# Patient Record
Sex: Female | Born: 1990 | Hispanic: No | Marital: Married | Smoking: Never smoker
Health system: Southern US, Community
[De-identification: ages and names within clinical notes are randomized; demographics above are authoritative.]

## PROBLEM LIST (undated history)

## (undated) DIAGNOSIS — T8859XA Other complications of anesthesia, initial encounter: Secondary | ICD-10-CM

## (undated) DIAGNOSIS — R200 Anesthesia of skin: Secondary | ICD-10-CM

## (undated) DIAGNOSIS — L709 Acne, unspecified: Secondary | ICD-10-CM

## (undated) DIAGNOSIS — B360 Pityriasis versicolor: Secondary | ICD-10-CM

## (undated) DIAGNOSIS — D649 Anemia, unspecified: Secondary | ICD-10-CM

## (undated) DIAGNOSIS — F909 Attention-deficit hyperactivity disorder, unspecified type: Secondary | ICD-10-CM

## (undated) HISTORY — DX: Acne, unspecified: L70.9

## (undated) HISTORY — DX: Pityriasis versicolor: B36.0

## (undated) HISTORY — DX: Attention-deficit hyperactivity disorder, unspecified type: F90.9

---
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
## 2011-04-20 ENCOUNTER — Encounter: Payer: Self-pay | Admitting: Family Medicine

## 2011-04-20 ENCOUNTER — Ambulatory Visit (INDEPENDENT_AMBULATORY_CARE_PROVIDER_SITE_OTHER): Payer: BC Managed Care – PPO | Admitting: Family Medicine

## 2011-04-20 VITALS — BP 102/60 | HR 95 | Temp 98.6°F | Ht 66.0 in | Wt 126.0 lb

## 2011-04-20 DIAGNOSIS — B360 Pityriasis versicolor: Secondary | ICD-10-CM

## 2011-04-20 DIAGNOSIS — F909 Attention-deficit hyperactivity disorder, unspecified type: Secondary | ICD-10-CM

## 2011-04-20 DIAGNOSIS — L708 Other acne: Secondary | ICD-10-CM

## 2011-04-20 DIAGNOSIS — L709 Acne, unspecified: Secondary | ICD-10-CM

## 2011-04-20 MED ORDER — AMPHETAMINE-DEXTROAMPHETAMINE 20 MG PO TABS: 20.0000 mg | ORAL_TABLET | Freq: Two times a day (BID) | ORAL | Status: DC

## 2011-04-20 MED ORDER — MINOCYCLINE HCL 100 MG PO TABS: 100.0000 mg | ORAL_TABLET | Freq: Two times a day (BID) | ORAL | Status: DC

## 2011-04-20 MED ORDER — KETOCONAZOLE 200 MG PO TABS: ORAL_TABLET | ORAL | Status: DC

## 2011-04-20 NOTE — Progress Notes (Signed)
20 yo here to establish care.  1.  Tinea versicolor- started on back several months ago.  Saw her pediatrician, given Selenium shampoo.  Has not helped, now has it on back of both her legs as well. Itchy.  2. ADHD- per pt, formal evaluation and diagnosis in 4th grade. On Adderall 20 mg twice daily.  Takes it year round.  3.  Acne- takes Minocycline which has greatly improved her skin. Has been on this for several months.  Well woman- G0. Sexually active with one partner, on Brevicon OCPs. No h/o STDs.  Patient Active Problem List  Diagnoses  . ADHD (attention deficit hyperactivity disorder)  . Tinea versicolor  . Acne   Past Medical History  Diagnosis Date  . ADHD (attention deficit hyperactivity disorder)   . Tinea versicolor   . Acne    No past surgical history on file. History  Substance Use Topics  . Smoking status: Never Smoker   . Smokeless tobacco: Not on file  . Alcohol Use: Not on file   No Known Allergies  The PMH, PSH, Social History, Family History, Medications, and allergies have been reviewed in [facility], and have been updated if relevant.  ROS: See HPI Patient reports no  vision/ hearing changes,anorexia, weight change, fever ,adenopathy, persistant / recurrent hoarseness, swallowing issues, chest pain, edema,persistant / recurrent cough, hemoptysis, dyspnea(rest, exertional, paroxysmal nocturnal), gastrointestinal  bleeding (melena, rectal bleeding), abdominal pain, excessive heart burn, GU symptoms(dysuria, hematuria, pyuria, voiding/incontinence  Issues) syncope, focal weakness, severe memory loss, depression, anxiety, abnormal bruising/bleeding, major joint swelling, breast masses or abnormal vaginal bleeding.    Physical exam: BP 102/60  Pulse 95  Temp(Src) 98.6 F (37 C) (Oral)  Ht 5\' 6"  (1.676 m)  Wt 126 lb (57.153 kg)  BMI 20.34 kg/m2  LMP 03/29/2011  General:  Well-developed,well-nourished,in no acute distress; alert,appropriate and cooperative  throughout examination Head:  normocephalic and atraumatic.   Eyes:  vision grossly intact, pupils equal, pupils round, and pupils reactive to light.   Ears:  R ear normal and L ear normal.   Nose:  no external deformity.   Mouth:  good dentition.   Neck:  No deformities, masses, or tenderness noted. Lungs:  Normal respiratory effort, chest expands symmetrically. Lungs are clear to auscultation, no crackles or wheezes. Heart:  Normal rate and regular rhythm. S1 and S2 normal without gallop, murmur, click, rub or other extra sounds. Abdomen:  Bowel sounds positive,abdomen soft and non-tender without masses, organomegaly or hernias noted. Msk:  No deformity or scoliosis noted of thoracic or lumbar spine.   Extremities:  No clubbing, cyanosis, edema, or deformity noted with normal full range of motion of all joints.   Neurologic:  alert & oriented X3 and gait normal.   Skin: pos hypopigemented leasions on back and back of leg consistent with tinea versicolor Cervical Nodes:  No lymphadenopathy noted Axillary Nodes:  No palpable lymphadenopathy Psych:  Cognition and judgment appear intact. Alert and cooperative with normal attention span and concentration. No apparent delusions, illusions, hallucinations  1. ADHD (attention deficit hyperactivity disorder)  Stable. Refilled her adderall rx. Awaiting records  2. Tinea versicolor  Deteriorated. Will treat with oral ketoconazole 200 mg daily x 7 days. Pt to call in 1-2 weeks with an update. Also given handout about tinea versicolor.  3. Acne  Stable. Continue minocycline, OCPs.

## 2011-04-20 NOTE — Patient Instructions (Signed)
Tinea Versicolor (Yeast Infection of the Skin) Tinea versicolor is a common yeast infection of the skin. This condition becomes known when the yeast on our skin starts to overgrow (yeast is a normal inhabitant on our skin). This condition is noticed as white or light brown patches on brown skin, and is more evident in the summer on tanned skin. These areas are slightly scaly if scratched. The light patches from the yeast become evident when the yeast creates "holes in your suntan". This is most often noticed in the summer. The patches are usually located on the chest, back, pubis, neck and body folds. However, it may occur on any area of body. Mild itching and inflammation (redness or soreness) may be present. DIAGNOSIS The diagnosis of this is made clinically (by looking). Cultures from samples are usually not needed. Examination under the microscope may help. However, yeast is normally found on skin. The diagnosis still remains clinical. Examination under Wood's Ultraviolet Light can determine the extent of the infection. TREATMENT This common infection is usually only of cosmetic (only a concern to your appearance). It is easily treated with dandruff shampoo such as Selsun Blue used during showers or bathing. Vigorous scrubbing will eliminate the yeast over several days time. The light areas in your skin may remain for weeks or months after the infection is cured unless your skin is exposed to sunlight. The lighter or darker spots caused by the fungus that remain after complete treatment are not a sign of treatment failure; it will take a long time to resolve. Your caregiver may recommend a number of commercial preparations or medication by mouth if home care is not working. Recurrence is common and preventative medication may be necessary. This skin condition is not highly contagious. Special care is not needed to protect close friends and family members. Normal hygiene is usually enough.   Great to  meet you!

## 2011-05-19 ENCOUNTER — Other Ambulatory Visit: Payer: Self-pay | Admitting: *Deleted

## 2011-05-19 MED ORDER — AMPHETAMINE-DEXTROAMPHETAMINE 20 MG PO TABS: 20.0000 mg | ORAL_TABLET | Freq: Two times a day (BID) | ORAL | Status: DC

## 2011-05-20 NOTE — Telephone Encounter (Signed)
Patient advised rx ready for pick up 

## 2011-06-21 ENCOUNTER — Other Ambulatory Visit: Payer: Self-pay | Admitting: *Deleted

## 2011-06-21 MED ORDER — AMPHETAMINE-DEXTROAMPHETAMINE 20 MG PO TABS: 20.0000 mg | ORAL_TABLET | Freq: Two times a day (BID) | ORAL | Status: DC

## 2011-06-21 NOTE — Telephone Encounter (Signed)
Please call pt when ready.

## 2011-06-22 NOTE — Telephone Encounter (Signed)
Dr. Dayton Martes, please reprint script when you are back in the office tomorrow.  We cant find the script that was printed yesterday.

## 2011-06-23 MED ORDER — AMPHETAMINE-DEXTROAMPHETAMINE 20 MG PO TABS: 20.0000 mg | ORAL_TABLET | Freq: Two times a day (BID) | ORAL | Status: DC

## 2011-06-23 NOTE — Telephone Encounter (Signed)
Rx printed

## 2011-06-23 NOTE — Telephone Encounter (Signed)
Left message on cell phone voicemail advising patient that Rx is ready for pick up will be left at front desk. 

## 2011-06-24 ENCOUNTER — Ambulatory Visit (INDEPENDENT_AMBULATORY_CARE_PROVIDER_SITE_OTHER): Payer: BC Managed Care – PPO | Admitting: Family Medicine

## 2011-06-24 ENCOUNTER — Encounter: Payer: Self-pay | Admitting: Family Medicine

## 2011-06-24 VITALS — BP 122/64 | HR 92 | Temp 98.1°F | Wt 129.0 lb

## 2011-06-24 DIAGNOSIS — M549 Dorsalgia, unspecified: Secondary | ICD-10-CM

## 2011-06-24 DIAGNOSIS — M545 Low back pain, unspecified: Secondary | ICD-10-CM

## 2011-06-24 LAB — POCT URINALYSIS DIPSTICK
Blood, UA: NEGATIVE
Glucose, UA: NEGATIVE
Ketones, UA: NEGATIVE
Spec Grav, UA: 1.03
Urobilinogen, UA: 0.2

## 2011-06-24 MED ORDER — CYCLOBENZAPRINE HCL 5 MG PO TABS: 5.0000 mg | ORAL_TABLET | Freq: Two times a day (BID) | ORAL | Status: DC | PRN

## 2011-06-24 NOTE — Progress Notes (Signed)
  Subjective:    Patient ID: Mercedes Kramer, female    DOB: Mar 28, 1991, 20 y.o.   MRN: [id number]  HPI CC: L sided back pain  3d h/o L back pain in flank region, crampy pain, no radiation.  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  Worse with twisting of back.  Has tried tylenol which helped some.  Denies new exercise routine, injury/trauma.  Denies dysuria, abd pain, n/v/f/c.  Denies hematuria.  However, did help grandmother move over weekend.  H/o UTI, last >6 mo ago, never had kidney stone.  Review of Systems Per HPI    Objective:   Physical Exam  Nursing note and vitals reviewed. Constitutional: She appears well-developed and well-nourished. No distress.  HENT:  Head: Normocephalic and atraumatic.  Mouth/Throat: Oropharynx is clear and moist. No oropharyngeal exudate.  Eyes: Conjunctivae and EOM are normal. Pupils are equal, round, and reactive to light. No scleral icterus.  Cardiovascular: Normal rate, regular rhythm, normal heart sounds and intact distal pulses.   No murmur heard. Pulmonary/Chest: Effort normal and breath sounds normal. No respiratory distress. She has no wheezes. She has no rales.  Abdominal: Soft. Bowel sounds are normal. She exhibits no distension and no mass. There is no hepatosplenomegaly. There is no tenderness. There is no rebound, no guarding and no CVA tenderness.  Musculoskeletal: She exhibits no edema.       Thoracic back: Normal.       Lumbar back: Normal.       Back:       Tender to palpation left lateral lower back (below flank region), worse with extension and right flexion at spine  Skin: Skin is warm and dry. No rash noted.  Psychiatric: She has a normal mood and affect.          Assessment & Plan:

## 2011-06-24 NOTE — Patient Instructions (Signed)
This sounds more like a muscular strain. Take tylenol for pain and may use flexeril muscle relaxant for muscle strain (caution it may make you sleepy so just take when you're at home). If fever >101 develops, or nausea/vomiting or burning with urination, or not improving as expected, please return to be seen. Good to see you today, call us with questions.

## 2011-06-24 NOTE — Assessment & Plan Note (Signed)
Left lower back pain reproducible with palpation, positional.   UA normal. Anticipate MSK issue.  Treat with tylenol, ice/heat, flexeril low dose. Update if any concerning sxs as per instructions.  Push fluids. AFVSS.

## 2011-07-08 ENCOUNTER — Other Ambulatory Visit: Payer: Self-pay | Admitting: *Deleted

## 2011-07-08 MED ORDER — NORETHINDRONE-ETH ESTRADIOL 0.5-35 MG-MCG PO TABS: 1.0000 | ORAL_TABLET | Freq: Every day | ORAL | Status: DC

## 2011-08-02 ENCOUNTER — Other Ambulatory Visit: Payer: Self-pay | Admitting: *Deleted

## 2011-08-02 NOTE — Telephone Encounter (Signed)
Please call pt when ready.

## 2011-08-03 MED ORDER — AMPHETAMINE-DEXTROAMPHETAMINE 20 MG PO TABS: 20.0000 mg | ORAL_TABLET | Freq: Two times a day (BID) | ORAL | Status: DC

## 2011-08-03 NOTE — Telephone Encounter (Signed)
Left message on cell phone voicemail.  Rx ready for pick up will be left at front desk. 

## 2011-08-17 ENCOUNTER — Telehealth: Payer: Self-pay | Admitting: *Deleted

## 2011-08-17 NOTE — Telephone Encounter (Signed)
BMET (v70.0), lipid panel (v81.0).

## 2011-08-17 NOTE — Telephone Encounter (Signed)
Advised pt, lab appt scheduled. 

## 2011-08-17 NOTE — Telephone Encounter (Signed)
Pt states that when she was here for her first appt you had told her to come back at some point to have labs.  I dont see mention of that in your note from that visit.  Please order what labs you want pt to have and I will schedule.

## 2011-08-24 ENCOUNTER — Telehealth: Payer: Self-pay | Admitting: *Deleted

## 2011-08-24 ENCOUNTER — Other Ambulatory Visit (INDEPENDENT_AMBULATORY_CARE_PROVIDER_SITE_OTHER): Payer: BC Managed Care – PPO

## 2011-08-24 ENCOUNTER — Encounter: Payer: Self-pay | Admitting: *Deleted

## 2011-08-24 DIAGNOSIS — Z136 Encounter for screening for cardiovascular disorders: Secondary | ICD-10-CM

## 2011-08-24 DIAGNOSIS — Z0000 Encounter for general adult medical examination without abnormal findings: Secondary | ICD-10-CM

## 2011-08-24 LAB — LIPID PANEL
Cholesterol: 128 mg/dL (ref 0–200)
HDL: 55 mg/dL (ref >39.00)
LDL Cholesterol: 51 mg/dL (ref 0–99)
Total CHOL/HDL Ratio: 2
Triglycerides: 111 mg/dL (ref 0.0–149.0)
VLDL: 22.2 mg/dL (ref 0.0–40.0)

## 2011-08-24 LAB — BASIC METABOLIC PANEL
CO2: 25 mEq/L (ref 19–32)
Chloride: 107 mEq/L (ref 96–112)
Potassium: 4.3 mEq/L (ref 3.5–5.1)
Sodium: 140 mEq/L (ref 135–145)

## 2011-08-24 NOTE — Telephone Encounter (Signed)
Advised patient form ready for pick up, will be left at front desk.  There is a $20.00 fee associated with filling out the form.  Immunizations printed out from [facility] with up to date immunization information.  Advised patient that if we didn't give the vaccines at our office then we can only attach a copy for her and not fill it in on the form.

## 2011-08-24 NOTE — Telephone Encounter (Signed)
In my box. Immunizations need to be updated. Thanks

## 2011-08-24 NOTE — Telephone Encounter (Signed)
Pt has dropped off forms for school.  These are on your desk. She doesn't want the 2nd half of the part 2 form filled out.

## 2011-09-26 ENCOUNTER — Other Ambulatory Visit: Payer: Self-pay | Admitting: *Deleted

## 2011-09-26 MED ORDER — AMPHETAMINE-DEXTROAMPHETAMINE 20 MG PO TABS: 20.0000 mg | ORAL_TABLET | Freq: Two times a day (BID) | ORAL | Status: DC

## 2011-09-26 NOTE — Telephone Encounter (Signed)
Patient advised via message left on cell phone voicemail that Rx is ready for pick up will be left at front desk.

## 2011-09-26 NOTE — Telephone Encounter (Signed)
Patient called for refill on her Adderall. Please let patient know when script is ready for pickup.

## 2011-11-03 ENCOUNTER — Ambulatory Visit (INDEPENDENT_AMBULATORY_CARE_PROVIDER_SITE_OTHER): Payer: BC Managed Care – PPO | Admitting: Family Medicine

## 2011-11-03 ENCOUNTER — Encounter: Payer: Self-pay | Admitting: Family Medicine

## 2011-11-03 VITALS — BP 120/60 | HR 82 | Temp 98.6°F | Ht 66.0 in | Wt 137.8 lb

## 2011-11-03 DIAGNOSIS — F909 Attention-deficit hyperactivity disorder, unspecified type: Secondary | ICD-10-CM

## 2011-11-03 DIAGNOSIS — Z309 Encounter for contraceptive management, unspecified: Secondary | ICD-10-CM | POA: Insufficient documentation

## 2011-11-03 MED ORDER — NORETHINDRONE ACET-ETHINYL EST 1.5-30 MG-MCG PO TABS: 1.0000 | ORAL_TABLET | Freq: Every day | ORAL | Status: DC

## 2011-11-03 MED ORDER — AMPHETAMINE-DEXTROAMPHETAMINE 30 MG PO TABS: 30.0000 mg | ORAL_TABLET | Freq: Two times a day (BID) | ORAL | Status: DC

## 2011-11-03 MED ORDER — AMPHETAMINE-DEXTROAMPHETAMINE 30 MG PO TABS: 30.0000 mg | ORAL_TABLET | Freq: Every day | ORAL | Status: DC

## 2011-11-03 NOTE — Progress Notes (Signed)
21 yo here for follow up.  1.  OCPs- was previously on Brevicon but pharmacy no longer carried it. Current generic form has caused spotting mid month for past several months. Would like to switch to another OCP.  2. ADHD- per pt, formal evaluation and diagnosis in 4th grade. On Adderall 20 mg twice daily.  Takes it year round.  Just started clinical in nursing school, would like to increase dose a little. Having harder time concentrating in afternoon.   Patient Active Problem List  Diagnoses  . ADHD (attention deficit hyperactivity disorder)  . Tinea versicolor  . Acne  . Lower back pain  . Contraception management   Past Medical History  Diagnosis Date  . ADHD (attention deficit hyperactivity disorder)   . Tinea versicolor   . Acne    No past surgical history on file. History  Substance Use Topics  . Smoking status: Never Smoker   . Smokeless tobacco: Not on file  . Alcohol Use: Not on file   No Known Allergies  The PMH, PSH, Social History, Family History, Medications, and allergies have been reviewed in [facility], and have been updated if relevant.  ROS: See HPI Patient reports no  vision/ hearing changes,anorexia, weight change, fever ,adenopathy, persistant / recurrent hoarseness, swallowing issues, chest pain, edema,persistant / recurrent cough, hemoptysis, dyspnea(rest, exertional, paroxysmal nocturnal), gastrointestinal  bleeding (melena, rectal bleeding), abdominal pain, excessive heart burn, GU symptoms(dysuria, hematuria, pyuria, voiding/incontinence  Issues) syncope, focal weakness, severe memory loss, depression, anxiety, abnormal bruising/bleeding, major joint swelling, breast masses or abnormal vaginal bleeding.    Physical exam: BP 120/60  Pulse 82  Temp(Src) 98.6 F (37 C) (Oral)  Ht 5\' 6"  (1.676 m)  Wt 137 lb 12.8 oz (62.506 kg)  BMI 22.24 kg/m2  SpO2 98%  General:  Well-developed,well-nourished,in no acute distress; alert,appropriate and cooperative  throughout examination Head:  normocephalic and atraumatic.   Eyes:  vision grossly intact, pupils equal, pupils round, and pupils reactive to light.   Ears:  R ear normal and L ear normal.   Nose:  no external deformity.   Mouth:  good dentition.   Neck:  No deformities, masses, or tenderness noted. Lungs:  Normal respiratory effort, chest expands symmetrically. Lungs are clear to auscultation, no crackles or wheezes. Heart:  Normal rate and regular rhythm. S1 and S2 normal without gallop, murmur, click, rub or other extra sounds. Abdomen:  Bowel sounds positive,abdomen soft and non-tender without masses, organomegaly or hernias noted. Msk:  No deformity or scoliosis noted of thoracic or lumbar spine.   Extremities:  No clubbing, cyanosis, edema, or deformity noted with normal full range of motion of all joints.   Neurologic:  alert & oriented X3 and gait normal.   Psych:  Cognition and judgment appear intact. Alert and cooperative with normal attention span and concentration. No apparent delusions, illusions, hallucinations  Assessment and Plan: 1. ADHD (attention deficit hyperactivity disorder)  Deteriorated.  She would like to avoid XL. Will increase dose to Adderral 30 mg twice daily The patient indicates understanding of these issues and agrees with the plan.   2. Contraception management  Discussed different OCPs. We will try Loestrin. She will call in 2 months with an update.

## 2011-12-05 ENCOUNTER — Ambulatory Visit (INDEPENDENT_AMBULATORY_CARE_PROVIDER_SITE_OTHER): Payer: BC Managed Care – PPO | Admitting: Family Medicine

## 2011-12-05 ENCOUNTER — Encounter: Payer: Self-pay | Admitting: Family Medicine

## 2011-12-05 VITALS — BP 102/60 | HR 92 | Temp 98.2°F | Wt 138.8 lb

## 2011-12-05 DIAGNOSIS — F411 Generalized anxiety disorder: Secondary | ICD-10-CM

## 2011-12-05 DIAGNOSIS — F418 Other specified anxiety disorders: Secondary | ICD-10-CM

## 2011-12-05 MED ORDER — AMPHETAMINE-DEXTROAMPHETAMINE 30 MG PO TABS: 30.0000 mg | ORAL_TABLET | Freq: Two times a day (BID) | ORAL | Status: DC

## 2011-12-05 NOTE — Patient Instructions (Signed)
Good luck tomorrow. Call me after your test and we can discuss testing modifications.

## 2011-12-05 NOTE — Progress Notes (Signed)
21 yo here for follow up.  1.  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 She is very anxious that she is going to fail it. Per pt, asked her advisor and advisor asked her to come to [facility]D for medication for testing anxiety. Denies any symptoms of depression. No SI or HI.  2. ADHD- per pt, formal evaluation and diagnosis in 4th grade. On Adderall 30 mg twice daily.  Takes it year round.  Recently increased to 30 mg from 20 mg dosage.   Patient Active Problem List  Diagnoses  . ADHD (attention deficit hyperactivity disorder)  . Tinea versicolor  . Acne  . Lower back pain  . Contraception management  . Test anxiety   Past Medical History  Diagnosis Date  . ADHD (attention deficit hyperactivity disorder)   . Tinea versicolor   . Acne    No past surgical history on file. History  Substance Use Topics  . Smoking status: Never Smoker   . Smokeless tobacco: Not on file  . Alcohol Use: Not on file   No Known Allergies  The PMH, PSH, Social History, Family History, Medications, and allergies have been reviewed in [facility], and have been updated if relevant.  ROS: See HPI   Physical exam: BP 102/60  Pulse 92  Temp(Src) 98.2 F (36.8 C) (Oral)  Wt 138 lb 12 oz (62.937 kg)  General:  Well-developed,well-nourished,in no acute distress; alert,appropriate and cooperative throughout examination Head:  normocephalic and atraumatic.   Eyes:  vision grossly intact, pupils equal, pupils round, and pupils reactive to light.   Ears:  R ear normal and L ear normal.   Nose:  no external deformity.   Mouth:  good dentition.   Neck:  No deformities, masses, or tenderness noted. Lungs:  Normal respiratory effort, chest expands symmetrically. Lungs are clear to auscultation, no crackles or wheezes. Heart:  Normal rate and regular rhythm. S1 and S2 normal without gallop, murmur, click, rub or other extra sounds. Abdomen:  Bowel sounds positive,abdomen  soft and non-tender without masses, organomegaly or hernias noted. Msk:  No deformity or scoliosis noted of thoracic or lumbar spine.   Extremities:  No clubbing, cyanosis, edema, or deformity noted with normal full range of motion of all joints.   Neurologic:  alert & oriented X3 and gait normal.   Psych:  Cognition and judgment appear intact. Alert and cooperative with normal attention span and concentration. No apparent delusions, illusions, hallucinations  Assessment and Plan:  1. Test anxiety    New. 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 Unfortunately, giving her a benzo prior to her test may interfere with effects of her adderall. Advised taking the test tomorrow (discussed breathing techniques) and call me after her test. Perhaps she would benefit from some testing modifications - extra time, sitting away from other students, due to her ADHD. The patient indicates understanding of these issues and agrees with the plan.

## 2012-01-17 ENCOUNTER — Other Ambulatory Visit: Payer: Self-pay | Admitting: *Deleted

## 2012-01-17 MED ORDER — AMPHETAMINE-DEXTROAMPHETAMINE 30 MG PO TABS: 30.0000 mg | ORAL_TABLET | Freq: Every day | ORAL | Status: DC

## 2012-01-17 NOTE — Telephone Encounter (Signed)
Left message on machine advising pt script is ready for pick up. 

## 2012-02-14 ENCOUNTER — Other Ambulatory Visit: Payer: Self-pay | Admitting: Family Medicine

## 2012-02-14 NOTE — Telephone Encounter (Signed)
Please enter as refill request. 

## 2012-02-14 NOTE — Telephone Encounter (Signed)
Pt is needing refill for Adderall 30 mg

## 2012-02-15 MED ORDER — AMPHETAMINE-DEXTROAMPHETAMINE 30 MG PO TABS: 30.0000 mg | ORAL_TABLET | Freq: Two times a day (BID) | ORAL | Status: DC

## 2012-02-15 NOTE — Telephone Encounter (Signed)
Mother is here to pick up script.  She says pt takes this twice a day.

## 2012-02-15 NOTE — Telephone Encounter (Signed)
Script given to pt's mother.

## 2012-03-22 ENCOUNTER — Other Ambulatory Visit: Payer: Self-pay

## 2012-03-22 MED ORDER — AMPHETAMINE-DEXTROAMPHETAMINE 30 MG PO TABS: 30.0000 mg | ORAL_TABLET | Freq: Every day | ORAL | Status: DC

## 2012-03-22 NOTE — Telephone Encounter (Signed)
Left message on pt's voice mail advising her script is ready for pick up.

## 2012-03-22 NOTE — Telephone Encounter (Signed)
Pt request refill Adderall. call when ready for pick up.

## 2012-04-19 ENCOUNTER — Other Ambulatory Visit: Payer: Self-pay

## 2012-04-19 MED ORDER — AMPHETAMINE-DEXTROAMPHETAMINE 30 MG PO TABS: 30.0000 mg | ORAL_TABLET | Freq: Every day | ORAL | Status: DC

## 2012-04-19 NOTE — Telephone Encounter (Signed)
Judeth Cornfield Virgo, pts mother request rx for Adderall. Call when ready for pick up.

## 2012-04-19 NOTE — Telephone Encounter (Signed)
Left message advising patient script is ready for pick up, script placed up front.

## 2012-05-23 ENCOUNTER — Other Ambulatory Visit: Payer: Self-pay

## 2012-05-23 NOTE — Telephone Encounter (Signed)
Pt left v/m requesting rx Adderall.call when ready for pick up. 

## 2012-05-24 ENCOUNTER — Other Ambulatory Visit: Payer: Self-pay | Admitting: Family Medicine

## 2012-05-24 MED ORDER — AMPHETAMINE-DEXTROAMPHETAMINE 30 MG PO TABS: 30.0000 mg | ORAL_TABLET | Freq: Every day | ORAL | Status: DC

## 2012-05-24 MED ORDER — AMPHETAMINE-DEXTROAMPHETAMINE 30 MG PO TABS: 30.0000 mg | ORAL_TABLET | Freq: Two times a day (BID) | ORAL | Status: DC

## 2012-05-24 NOTE — Telephone Encounter (Signed)
Pt is needing refill on adderall °

## 2012-05-24 NOTE — Telephone Encounter (Signed)
Ok to print out one month supply.

## 2012-05-24 NOTE — Telephone Encounter (Signed)
This has been taken care of, pt was given a script for 2 a day earlier today.

## 2012-05-24 NOTE — Telephone Encounter (Signed)
Clarified dose with patient, she takes 2 tablets a day. Printed script, per Dr Dayton Martes, script given to patient.

## 2012-07-10 ENCOUNTER — Other Ambulatory Visit: Payer: Self-pay

## 2012-07-10 MED ORDER — AMPHETAMINE-DEXTROAMPHETAMINE 30 MG PO TABS: 30.0000 mg | ORAL_TABLET | Freq: Two times a day (BID) | ORAL | Status: DC

## 2012-07-10 NOTE — Telephone Encounter (Signed)
Left message advising pt script is ready for pick up. 

## 2012-07-10 NOTE — Telephone Encounter (Signed)
Pt request rx Adderall, call when ready for pick up. Pt out of med and would like to pick up today.

## 2012-08-20 ENCOUNTER — Other Ambulatory Visit: Payer: Self-pay

## 2012-08-20 MED ORDER — AMPHETAMINE-DEXTROAMPHETAMINE 30 MG PO TABS: 30.0000 mg | ORAL_TABLET | Freq: Every day | ORAL | Status: DC

## 2012-08-20 NOTE — Telephone Encounter (Signed)
Script placed on doctors desk for signature. 

## 2012-08-20 NOTE — Telephone Encounter (Signed)
Pt left v/m requesting call back if rx ready for pick up.

## 2012-08-20 NOTE — Telephone Encounter (Signed)
Pt request rx adderall. Call when ready for pick up. 

## 2012-08-21 NOTE — Telephone Encounter (Signed)
Left message advising patient script is ready for pick up, script placed at front desk. 

## 2012-09-04 ENCOUNTER — Other Ambulatory Visit: Payer: Self-pay | Admitting: Family Medicine

## 2012-09-20 ENCOUNTER — Other Ambulatory Visit: Payer: Self-pay

## 2012-09-20 MED ORDER — AMPHETAMINE-DEXTROAMPHETAMINE 30 MG PO TABS: 30.0000 mg | ORAL_TABLET | Freq: Every day | ORAL | Status: DC

## 2012-09-20 NOTE — Telephone Encounter (Signed)
Script is ready for pick up.  Called pt, no answer on cell and home phones, voice mail not set up on either.

## 2012-09-20 NOTE — Telephone Encounter (Signed)
Pt request rx adderall. Call when ready for pick up. 

## 2012-09-21 NOTE — Telephone Encounter (Signed)
Called both numbers again today x 2, no answer on either.

## 2012-10-12 NOTE — Telephone Encounter (Signed)
rx picked up 09/24/12.

## 2012-10-18 ENCOUNTER — Telehealth: Payer: Self-pay | Admitting: Family Medicine

## 2012-10-18 ENCOUNTER — Other Ambulatory Visit: Payer: Self-pay | Admitting: Family Medicine

## 2012-10-18 MED ORDER — AMPHETAMINE-DEXTROAMPHETAMINE 30 MG PO TABS: 30.0000 mg | ORAL_TABLET | Freq: Every day | ORAL | Status: DC

## 2012-10-18 NOTE — Telephone Encounter (Signed)
Left message advising patient script is ready for pick up at front desk.

## 2012-10-18 NOTE — Telephone Encounter (Signed)
Appointment scheduled for 12/14

## 2012-10-18 NOTE — Telephone Encounter (Signed)
pts family member left v/m requesting rx adderall. Call when ready for pick up.

## 2012-10-18 NOTE — Telephone Encounter (Signed)
Yes ok to schedule.

## 2012-10-18 NOTE — Telephone Encounter (Signed)
Pt's mother called to try to schedule a CPE for pt while she's home from college.  The first thing you have is 12/04/2012, but the pt is home from college now thru January 3rd, 2014. Can you accommodate her during this time period? Thank you.

## 2012-11-01 ENCOUNTER — Encounter: Payer: Self-pay | Admitting: Family Medicine

## 2012-11-01 ENCOUNTER — Ambulatory Visit (INDEPENDENT_AMBULATORY_CARE_PROVIDER_SITE_OTHER): Payer: BC Managed Care – PPO | Admitting: Family Medicine

## 2012-11-01 ENCOUNTER — Encounter: Payer: Self-pay | Admitting: *Deleted

## 2012-11-01 VITALS — BP 100/60 | HR 72 | Temp 97.3°F | Ht 66.5 in | Wt 146.0 lb

## 2012-11-01 DIAGNOSIS — Z309 Encounter for contraceptive management, unspecified: Secondary | ICD-10-CM

## 2012-11-01 DIAGNOSIS — Z136 Encounter for screening for cardiovascular disorders: Secondary | ICD-10-CM

## 2012-11-01 DIAGNOSIS — F339 Major depressive disorder, recurrent, unspecified: Secondary | ICD-10-CM | POA: Insufficient documentation

## 2012-11-01 DIAGNOSIS — F418 Other specified anxiety disorders: Secondary | ICD-10-CM

## 2012-11-01 DIAGNOSIS — Z0000 Encounter for general adult medical examination without abnormal findings: Secondary | ICD-10-CM

## 2012-11-01 DIAGNOSIS — F909 Attention-deficit hyperactivity disorder, unspecified type: Secondary | ICD-10-CM

## 2012-11-01 DIAGNOSIS — F411 Generalized anxiety disorder: Secondary | ICD-10-CM

## 2012-11-01 LAB — COMPREHENSIVE METABOLIC PANEL
ALT: 37 U/L — ABNORMAL HIGH (ref 0–35)
CO2: 27 mEq/L (ref 19–32)
Calcium: 8.8 mg/dL (ref 8.4–10.5)
Chloride: 103 mEq/L (ref 96–112)
Creatinine, Ser: 0.7 mg/dL (ref 0.4–1.2)
GFR: 114.42 mL/min (ref >60.00)
Glucose, Bld: 90 mg/dL (ref 70–99)
Sodium: 135 mEq/L (ref 135–145)
Total Protein: 6.9 g/dL (ref 6.0–8.3)

## 2012-11-01 LAB — TSH: TSH: 0.89 u[IU]/mL (ref 0.35–5.50)

## 2012-11-01 LAB — LIPID PANEL: Cholesterol: 118 mg/dL (ref 0–200)

## 2012-11-01 MED ORDER — MINOCYCLINE HCL 100 MG PO TABS: 100.0000 mg | ORAL_TABLET | Freq: Two times a day (BID) | ORAL | Status: DC

## 2012-11-01 MED ORDER — PROPRANOLOL HCL 20 MG PO TABS: ORAL_TABLET | ORAL | Status: DC

## 2012-11-01 MED ORDER — AMPHETAMINE-DEXTROAMPHETAMINE 30 MG PO TABS: 30.0000 mg | ORAL_TABLET | Freq: Two times a day (BID) | ORAL | Status: DC

## 2012-11-01 NOTE — Progress Notes (Signed)
22 yo here for CPX.  Contraceptive management-  Started Loestrin last January.  Having less spotting and feels this is working well.  Not currently sexually active.   ADHD- per pt, formal evaluation and diagnosis in 4th grade. On Adderall 30 twice daily.  Takes it year round.  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  Does sometimes have performance anxiety when she has to get up and do something in front of her class and teachers.  Otherwise denies feeling anxious or depressed.  Patient Active Problem List  Diagnosis  . ADHD (attention deficit hyperactivity disorder)  . Tinea versicolor  . Acne  . Lower back pain  . Contraception management  . Test anxiety  . Routine general medical examination at a health care facility   Past Medical History  Diagnosis Date  . ADHD (attention deficit hyperactivity disorder)   . Tinea versicolor   . Acne    No past surgical history on file. History  Substance Use Topics  . Smoking status: Never Smoker   . Smokeless tobacco: Not on file  . Alcohol Use: Not on file   No Known Allergies  The PMH, PSH, Social History, Family History, Medications, and allergies have been reviewed in [facility], and have been updated if relevant.  ROS: See HPI Patient reports no  vision/ hearing changes,anorexia, weight change, fever ,adenopathy, persistant / recurrent hoarseness, swallowing issues, chest pain, edema,persistant / recurrent cough, hemoptysis, dyspnea(rest, exertional, paroxysmal nocturnal), gastrointestinal  bleeding (melena, rectal bleeding), abdominal pain, excessive heart burn, GU symptoms(dysuria, hematuria, pyuria, voiding/incontinence  Issues) syncope, focal weakness, severe memory loss, depression, anxiety, abnormal bruising/bleeding, major joint swelling, breast masses or abnormal vaginal bleeding.    Physical exam: BP 100/60  Pulse 72  Temp 97.3 F (36.3 C)  Ht 5' 6.5" (1.689 m)  Wt 146 lb  (66.225 kg)  BMI 23.21 kg/m2 Wt Readings from Last 3 Encounters:  11/01/12 146 lb (66.225 kg)  12/05/11 138 lb 12 oz (62.937 kg) (67.37%*)  11/03/11 137 lb 12.8 oz (62.506 kg) (66.26%*)   * Growth percentiles are based on CDC 2-20 Years data.     General:  Well-developed,well-nourished,in no acute distress; alert,appropriate and cooperative throughout examination Head:  normocephalic and atraumatic.   Eyes:  vision grossly intact, pupils equal, pupils round, and pupils reactive to light.   Ears:  R ear normal and L ear normal.   Nose:  no external deformity.   Mouth:  good dentition.   Neck:  No deformities, masses, or tenderness noted. Lungs:  Normal respiratory effort, chest expands symmetrically. Lungs are clear to auscultation, no crackles or wheezes. Heart:  Normal rate and regular rhythm. S1 and S2 normal without gallop, murmur, click, rub or other extra sounds. Abdomen:  Bowel sounds positive,abdomen soft and non-tender without masses, organomegaly or hernias noted. Msk:  No deformity or scoliosis noted of thoracic or lumbar spine.   Extremities:  No clubbing, cyanosis, edema, or deformity noted with normal full range of motion of all joints.   Neurologic:  alert & oriented X3 and gait normal.   Psych:  Cognition and judgment appear intact. Alert and cooperative with normal attention span and concentration. No apparent delusions, illusions, hallucinations  Assessment and Plan: 1. Routine general medical examination at a health care facility  Reviewed preventive care protocols, scheduled due services, and updated immunizations Discussed nutrition, exercise, diet, and healthy lifestyle.  Comprehensive metabolic panel, TSH  2. ADHD (attention deficit hyperactivity disorder)  Stable on Adderall 30 mg twice daily . Rx refilled today.   3. Contraception management  Continue loestrin.   4. Screening for ischemic heart disease  Lipid Panel  5. Situational anxiety  Discussed  behavioral techniques.  Also given rx for propranolol 20 mg to try 30-60 minutes prior to public performance.  She will call me with an update in a few weeks.  Advised to try this prior to a big exam or performance. The patient indicates understanding of these issues and agrees with the plan.

## 2012-11-01 NOTE — Patient Instructions (Addendum)
Increase fiber and water, and try over the counter gas-x (four times a day when necessary) or beano for bloating.   pepcid 20 mg daily for next 2 weeks.  Exclude gas producing foods (beans, onions, celery, carrots, raisins, bananas, apricots, prunes, brussel sprouts, wheat germ, pretzels)  Consider trail of lactose free diet (back off milk)   Trial of align for bloating/IBS symptoms (OTC).    

## 2012-12-06 ENCOUNTER — Other Ambulatory Visit: Payer: Self-pay | Admitting: Family Medicine

## 2012-12-21 ENCOUNTER — Other Ambulatory Visit: Payer: Self-pay

## 2012-12-21 ENCOUNTER — Ambulatory Visit (INDEPENDENT_AMBULATORY_CARE_PROVIDER_SITE_OTHER): Payer: BC Managed Care – PPO | Admitting: Family Medicine

## 2012-12-21 ENCOUNTER — Encounter: Payer: Self-pay | Admitting: Family Medicine

## 2012-12-21 VITALS — BP 102/64 | HR 86 | Temp 98.0°F | Ht 66.5 in | Wt 144.0 lb

## 2012-12-21 DIAGNOSIS — J069 Acute upper respiratory infection, unspecified: Secondary | ICD-10-CM | POA: Insufficient documentation

## 2012-12-21 MED ORDER — AMPHETAMINE-DEXTROAMPHETAMINE 30 MG PO TABS: 30.0000 mg | ORAL_TABLET | Freq: Two times a day (BID) | ORAL | Status: DC

## 2012-12-21 NOTE — Progress Notes (Signed)
Subjective:    Patient ID: Mercedes Kramer, female    DOB: 07/27/1991, 21 y.o.   MRN: [id number]  HPI Had a cold about a week ago - really stopped up nose and ears and body aches  Took a zpack and not improving (dx with a sinus infection)- at clinic at school Coughing at night - miserable - has to prop herself up  Tight chest for 3 mornings  No wheezing  No hx of asthma  Not a smoker   Nasal discharge - green  Cough- prod of green mucous alo   Days total sick 6 days   The fever was early on   Patient Active Problem List  Diagnosis  . ADHD (attention deficit hyperactivity disorder)  . Tinea versicolor  . Acne  . Lower back pain  . Contraception management  . Test anxiety  . Routine general medical examination at a health care facility  . Situational anxiety   Past Medical History  Diagnosis Date  . ADHD (attention deficit hyperactivity disorder)   . Tinea versicolor   . Acne    No past surgical history on file. History  Substance Use Topics  . Smoking status: Never Smoker   . Smokeless tobacco: Not on file  . Alcohol Use: No   No family history on file. No Known Allergies Current Outpatient Prescriptions on File Prior to Visit  Medication Sig Dispense Refill  . amphetamine-dextroamphetamine (ADDERALL) 30 MG tablet Take 1 tablet (30 mg total) by mouth 2 (two) times daily.  60 tablet  0  . JUNEL 1.5/30 1.5-30 MG-MCG tablet TAKE 1 TABLET BY MOUTH DAILY.  21 tablet  3  . minocycline (DYNACIN) 100 MG tablet Take 1 tablet (100 mg total) by mouth 2 (two) times daily.  60 tablet  6  . propranolol (INDERAL) 20 MG tablet TAKE 1 TABLET 30 TO 60 MINUTES PRIOR TO PUBLIC PERFORMANCE  30 tablet  0   No current facility-administered medications on file prior to visit.        Review of Systems Review of Systems  Constitutional: Negative for fever, appetite change, and unexpected weight change. ENT neg for sinus pain / ear pain or d/c  Eyes: Negative for pain and visual  disturbance.  Respiratory: Negative for wheeze and shortness of breath.   Cardiovascular: Negative for cp or palpitations    Gastrointestinal: Negative for nausea, diarrhea and constipation.  Genitourinary: Negative for urgency and frequency.  Skin: Negative for pallor or rash   Neurological: Negative for weakness, light-headedness, numbness and headaches.  Hematological: Negative for adenopathy. Does not bruise/bleed easily.  Psychiatric/Behavioral: Negative for dysphoric mood. The patient is not nervous/anxious.         Objective:   Physical Exam  Constitutional: She appears well-developed and well-nourished. No distress.  HENT:  Head: Normocephalic and atraumatic.  Right Ear: External ear normal.  Left Ear: External ear normal.  Mouth/Throat: Oropharynx is clear and moist. No oropharyngeal exudate.  Nares are injected and boggy No sinus tenderness  Eyes: Conjunctivae and EOM are normal. Pupils are equal, round, and reactive to light. Right eye exhibits no discharge. Left eye exhibits no discharge.  Neck: Normal range of motion. Neck supple.  Cardiovascular: Normal rate and regular rhythm.   Pulmonary/Chest: Effort normal and breath sounds normal. No respiratory distress. She has no wheezes. She has no rales.  Harsh/ loud cough No rhonchi or wheeze   Lymphadenopathy:    She has no cervical adenopathy.  Neurological: She is alert.  Skin: Skin is warm and dry. No rash noted.  Psychiatric: She has a normal mood and affect.          Assessment & Plan:

## 2012-12-21 NOTE — Telephone Encounter (Signed)
Pt left v/m requesting rx adderall. Call when ready for pick up. 

## 2012-12-21 NOTE — Assessment & Plan Note (Signed)
Suspect viral and that is why zpak given at her school did not work Disc diff between viral and bacterial infx To stop cough cycle given px for tessalon and robitussin ac-expl poss side eff  Disc symptomatic care - see instructions on AVS  Update if not starting to improve in a week or if worsening

## 2012-12-21 NOTE — Telephone Encounter (Signed)
Patient has picked up script

## 2012-12-21 NOTE — Patient Instructions (Addendum)
I think you have a viral upper respiratory infection that is moving into your chest  Drink lots of fluids and try to get some rest  Robitussin ac for bedtime- it will help sleep Tessalon three times per day  An antihistamine like zyrtec will be helpful for post nasal drip also

## 2012-12-23 MED ORDER — GUAIFENESIN-CODEINE 100-10 MG/5ML PO SYRP: 5.0000 mL | ORAL_SOLUTION | Freq: Four times a day (QID) | ORAL | Status: DC | PRN

## 2012-12-23 MED ORDER — BENZONATATE 200 MG PO CAPS: 200.0000 mg | ORAL_CAPSULE | Freq: Three times a day (TID) | ORAL | Status: DC | PRN

## 2012-12-24 ENCOUNTER — Ambulatory Visit: Payer: BC Managed Care – PPO | Admitting: Family Medicine

## 2013-01-01 ENCOUNTER — Other Ambulatory Visit: Payer: Self-pay | Admitting: Family Medicine

## 2013-01-02 ENCOUNTER — Telehealth: Payer: Self-pay

## 2013-01-02 NOTE — Telephone Encounter (Signed)
Pt left v/m requesting status of Junel refill; advised pt per v/m [facility] has Junel ready for pick up.

## 2013-01-03 NOTE — Telephone Encounter (Signed)
Pt called to ck on status of Junel refill. Advised pt [facility] said Junel was ready for pick up. [facility]. Pt said her v/m does not always pick up all of her calls.

## 2013-01-16 ENCOUNTER — Other Ambulatory Visit: Payer: Self-pay | Admitting: Family Medicine

## 2013-01-23 ENCOUNTER — Other Ambulatory Visit: Payer: Self-pay

## 2013-01-23 NOTE — Telephone Encounter (Signed)
Ok to print out and leave in my box for signature. 

## 2013-01-23 NOTE — Telephone Encounter (Signed)
Pt's mother request rx for adderall. Call when rx ready for pick up.

## 2013-01-24 MED ORDER — AMPHETAMINE-DEXTROAMPHETAMINE 30 MG PO TABS: 30.0000 mg | ORAL_TABLET | Freq: Two times a day (BID) | ORAL | Status: DC

## 2013-01-24 NOTE — Telephone Encounter (Signed)
Left message on voice mail advising patient script is ready for pick up. 

## 2013-02-28 ENCOUNTER — Other Ambulatory Visit: Payer: Self-pay

## 2013-02-28 MED ORDER — AMPHETAMINE-DEXTROAMPHETAMINE 30 MG PO TABS: 30.0000 mg | ORAL_TABLET | Freq: Two times a day (BID) | ORAL | Status: DC

## 2013-02-28 NOTE — Telephone Encounter (Signed)
Advised mother script is ready for pick up. 

## 2013-02-28 NOTE — Telephone Encounter (Signed)
pts mother request rx adderall. Call when ready for pick up. 

## 2013-03-09 ENCOUNTER — Other Ambulatory Visit: Payer: Self-pay | Admitting: Family Medicine

## 2013-04-23 ENCOUNTER — Encounter: Payer: Self-pay | Admitting: Family Medicine

## 2013-04-23 ENCOUNTER — Other Ambulatory Visit: Payer: Self-pay

## 2013-04-23 MED ORDER — AMPHETAMINE-DEXTROAMPHETAMINE 30 MG PO TABS: 30.0000 mg | ORAL_TABLET | Freq: Two times a day (BID) | ORAL | Status: DC

## 2013-04-23 NOTE — Telephone Encounter (Signed)
Pt left v/m requesting rx adderall. Call when ready for pick up. 

## 2013-04-23 NOTE — Telephone Encounter (Signed)
Advised patient script is ready for pick up, placed at front desk. 

## 2013-05-08 ENCOUNTER — Other Ambulatory Visit: Payer: Self-pay | Admitting: Family Medicine

## 2013-06-14 ENCOUNTER — Other Ambulatory Visit: Payer: Self-pay | Admitting: Family Medicine

## 2013-06-14 MED ORDER — AMPHETAMINE-DEXTROAMPHETAMINE 30 MG PO TABS: 30.0000 mg | ORAL_TABLET | Freq: Two times a day (BID) | ORAL | Status: DC

## 2013-06-18 ENCOUNTER — Other Ambulatory Visit: Payer: Self-pay | Admitting: Family Medicine

## 2013-08-21 ENCOUNTER — Other Ambulatory Visit: Payer: Self-pay

## 2013-08-21 NOTE — Telephone Encounter (Signed)
Ok to refill 

## 2013-08-21 NOTE — Telephone Encounter (Signed)
Pt's mother left v/m requesting rx adderall. Call when ready for pick up.  

## 2013-08-21 NOTE — Telephone Encounter (Signed)
OK to refill in Dr. Elmer Sow absence? Last filled 06/14/13.

## 2013-08-22 MED ORDER — AMPHETAMINE-DEXTROAMPHETAMINE 30 MG PO TABS: 30.0000 mg | ORAL_TABLET | Freq: Two times a day (BID) | ORAL | Status: DC

## 2013-08-22 NOTE — Telephone Encounter (Signed)
Message left advising patient's mother. Rx placed up front for pick up.

## 2013-08-22 NOTE — Telephone Encounter (Signed)
Printed and placed in Kim's box. 

## 2013-08-22 NOTE — Telephone Encounter (Signed)
Can you please print this? Thanks!

## 2013-09-15 ENCOUNTER — Other Ambulatory Visit: Payer: Self-pay | Admitting: Family Medicine

## 2013-09-16 NOTE — Telephone Encounter (Signed)
Received refill request electronically. Last office visit 12/21/12/acute. Is it okay to refill medication?

## 2013-09-24 ENCOUNTER — Other Ambulatory Visit: Payer: Self-pay | Admitting: Family Medicine

## 2013-10-10 ENCOUNTER — Encounter: Payer: Self-pay | Admitting: Family Medicine

## 2013-11-21 ENCOUNTER — Other Ambulatory Visit: Payer: Self-pay

## 2013-11-21 MED ORDER — AMPHETAMINE-DEXTROAMPHETAMINE 30 MG PO TABS: 30.0000 mg | ORAL_TABLET | Freq: Two times a day (BID) | ORAL | Status: DC

## 2013-11-21 NOTE — Telephone Encounter (Signed)
Pt left v/m requesting rx adderall. Call when ready for pick up. 

## 2013-11-21 NOTE — Telephone Encounter (Signed)
Lm on pts vm informing her Rx is available for pickup at the front desk;informed a gov't issued photo id required.

## 2013-11-22 ENCOUNTER — Encounter: Payer: Self-pay | Admitting: Family Medicine

## 2013-12-18 ENCOUNTER — Encounter: Payer: BC Managed Care – PPO | Admitting: Family Medicine

## 2013-12-20 ENCOUNTER — Encounter: Payer: Self-pay | Admitting: Internal Medicine

## 2013-12-20 ENCOUNTER — Ambulatory Visit (INDEPENDENT_AMBULATORY_CARE_PROVIDER_SITE_OTHER): Payer: BC Managed Care – PPO | Admitting: Internal Medicine

## 2013-12-20 VITALS — BP 100/68 | HR 84 | Temp 98.4°F | Wt 146.0 lb

## 2013-12-20 DIAGNOSIS — J029 Acute pharyngitis, unspecified: Secondary | ICD-10-CM

## 2013-12-20 DIAGNOSIS — J020 Streptococcal pharyngitis: Secondary | ICD-10-CM

## 2013-12-20 LAB — POCT RAPID STREP A (OFFICE): RAPID STREP A SCREEN: NEGATIVE

## 2013-12-20 MED ORDER — METHYLPREDNISOLONE ACETATE 40 MG/ML IJ SUSP: 80.0000 mg | Freq: Once | INTRAMUSCULAR | Status: DC

## 2013-12-20 NOTE — Progress Notes (Signed)
Pre visit review using our clinic review tool, if applicable. No additional management support is needed unless otherwise documented below in the visit note. 

## 2013-12-20 NOTE — Addendum Note (Signed)
Addended by: Roena MaladyEVONTENNO, MELANIE Y on: 12/20/2013 02:33 PM   Modules accepted: Orders

## 2013-12-20 NOTE — Patient Instructions (Addendum)
Pharyngitis °Pharyngitis is redness, pain, and swelling (inflammation) of your pharynx.  °CAUSES  °Pharyngitis is usually caused by infection. Most of the time, these infections are from viruses (viral) and are part of a cold. However, sometimes pharyngitis is caused by bacteria (bacterial). Pharyngitis can also be caused by allergies. Viral pharyngitis may be spread from person to person by coughing, sneezing, and personal items or utensils (cups, forks, spoons, toothbrushes). Bacterial pharyngitis may be spread from person to person by more intimate contact, such as kissing.  °SIGNS AND SYMPTOMS  °Symptoms of pharyngitis include:   °· Sore throat.   °· Tiredness (fatigue).   °· Low-grade fever.   °· Headache. °· Joint pain and muscle aches. °· Skin rashes. °· Swollen lymph nodes. °· Plaque-like film on throat or tonsils (often seen with bacterial pharyngitis). °DIAGNOSIS  °Your health care provider will ask you questions about your illness and your symptoms. Your medical history, along with a physical exam, is often all that is needed to diagnose pharyngitis. Sometimes, a rapid strep test is done. Other lab tests may also be done, depending on the suspected cause.  °TREATMENT  °Viral pharyngitis will usually get better in 3 4 days without the use of medicine. Bacterial pharyngitis is treated with medicines that kill germs (antibiotics).  °HOME CARE INSTRUCTIONS  °· Drink enough water and fluids to keep your urine clear or pale yellow.   °· Only take over-the-counter or prescription medicines as directed by your health care provider:   °· If you are prescribed antibiotics, make sure you finish them even if you start to feel better.   °· Do not take aspirin.   °· Get lots of rest.   °· Gargle with 8 oz of salt water (½ tsp of salt per 1 qt of water) as often as every 1 2 hours to soothe your throat.   °· Throat lozenges (if you are not at risk for choking) or sprays may be used to soothe your throat. °SEEK MEDICAL  CARE IF:  °· You have large, tender lumps in your neck. °· You have a rash. °· You cough up green, yellow-brown, or bloody spit. °SEEK IMMEDIATE MEDICAL CARE IF:  °· Your neck becomes stiff. °· You drool or are unable to swallow liquids. °· You vomit or are unable to keep medicines or liquids down. °· You have severe pain that does not go away with the use of recommended medicines. °· You have trouble breathing (not caused by a stuffy nose). °MAKE SURE YOU:  °· Understand these instructions. °· Will watch your condition. °· Will get help right away if you are not doing well or get worse. °Document Released: 10/17/2005 Document Revised: 08/07/2013 Document Reviewed: 06/24/2013 °Exit[employer]® Patient Information ©2014 [employer]. ° °

## 2013-12-20 NOTE — Progress Notes (Signed)
Subjective:    Patient ID: Mercedes Kramer, female    DOB: 01-Aug-1991, 23 y.o.   MRN: [id number]  HPI  Pt presents to the clinic today with c/o sore throat. She reports this started 2 days ago. She states that it feels like razor blades when she swallows. She denies fever, chills or body aches. She has taken Tylenol and Dayquil OTC without much relief. She reports her boyfriend had similar symptoms 6 days ago but tested negative for strep.  Review of Systems      Past Medical History  Diagnosis Date  . ADHD (attention deficit hyperactivity disorder)   . Tinea versicolor   . Acne     Current Outpatient Prescriptions  Medication Sig Dispense Refill  . amphetamine-dextroamphetamine (ADDERALL) 30 MG tablet Take 1 tablet (30 mg total) by mouth 2 (two) times daily.  60 tablet  0  . JUNEL 1.5/30 1.5-30 MG-MCG tablet TAKE 1 TABLET EVERY DAY  21 tablet  2  . minocycline (DYNACIN) 100 MG tablet Take 1 tablet (100 mg total) by mouth 2 (two) times daily.  60 tablet  6  . propranolol (INDERAL) 20 MG tablet TAKE 1 TABLET BY MOUTH 30 TO 60 MINUTES PRIOR TO PUBLIC PERFORMANCE AS DIRECTED  30 tablet  2   No current facility-administered medications for this visit.    No Known Allergies  History reviewed. No pertinent family history.  History   Social History  . Marital Status: Single    Spouse Name: N/A    Number of Children: N/A  . Years of Education: N/A   Occupational History  . Not on file.   Social History Main Topics  . Smoking status: Never Smoker   . Smokeless tobacco: Not on file  . Alcohol Use: No  . Drug Use: No  . Sexual Activity: Yes    Partners: Male    Birth Control/ Protection: OCP   Other Topics Concern  . Not on file   Social History Narrative   CNA     Constitutional: Denies fever, malaise, fatigue, headache or abrupt weight changes.  HEENT: Pt reports sore throat. Denies eye pain, eye redness, ear pain, ringing in the ears, wax buildup, runny nose,  nasal congestion, bloody nose.  No other specific complaints in a complete review of systems (except as listed in HPI above).  Objective:   Physical Exam  BP 100/68  Pulse 84  Temp(Src) 98.4 F (36.9 C) (Oral)  Wt 146 lb (66.225 kg)  SpO2 99% Wt Readings from Last 3 Encounters:  12/20/13 146 lb (66.225 kg)  12/21/12 144 lb (65.318 kg)  11/01/12 146 lb (66.225 kg)    General: Appears her stated age, well developed, well nourished in NAD. HEENT: Head: normal shape and size; Eyes: sclera white, no icterus, conjunctiva pink, PERRLA and EOMs intact; Ears: Tm's gray and intact, normal light reflex; Nose: mucosa pink and moist, septum midline; Throat/Mouth: Teeth present, mucosa erythematous and moist, no exudate, lesions or ulcerations noted.  Cardiovascular: Normal rate and rhythm. S1,S2 noted.  No murmur, rubs or gallops noted. No JVD or BLE edema. No carotid bruits noted. Pulmonary/Chest: Normal effort and positive vesicular breath sounds. No respiratory distress. No wheezes, rales or ronchi noted.    BMET    Component Value Date/Time   NA 135 11/01/2012 0834   K 4.0 11/01/2012 0834   CL 103 11/01/2012 0834   CO2 27 11/01/2012 0834   GLUCOSE 90 11/01/2012 0834   BUN 13 11/01/2012 0834  CREATININE 0.7 11/01/2012 0834   CALCIUM 8.8 11/01/2012 0834    Lipid Panel     Component Value Date/Time   CHOL 118 11/01/2012 0834   TRIG 109.0 11/01/2012 0834   HDL 55.70 11/01/2012 0834   CHOLHDL 2 11/01/2012 0834   VLDL 21.8 11/01/2012 0834   LDLCALC 41 11/01/2012 0834           Assessment & Plan:   Pharyngitis, likely viral:  RST: negative Do salt water gargles for the sore throat Take Ibuprofen OTC for pain and inflammation 80 mg Depo IM today  RTC as needed or if symptoms persist or worsen

## 2014-01-02 ENCOUNTER — Encounter: Payer: Self-pay | Admitting: Family Medicine

## 2014-01-02 ENCOUNTER — Other Ambulatory Visit (HOSPITAL_COMMUNITY)
Admission: RE | Admit: 2014-01-02 | Discharge: 2014-01-02 | Disposition: A | Discharge status: Home / Self Care | Payer: BC Managed Care – PPO | Source: Ambulatory Visit | Attending: Family Medicine | Admitting: Family Medicine

## 2014-01-02 ENCOUNTER — Ambulatory Visit (INDEPENDENT_AMBULATORY_CARE_PROVIDER_SITE_OTHER): Payer: BC Managed Care – PPO | Admitting: Family Medicine

## 2014-01-02 VITALS — BP 102/62 | HR 90 | Temp 98.0°F | Ht 66.0 in | Wt 141.2 lb

## 2014-01-02 DIAGNOSIS — F909 Attention-deficit hyperactivity disorder, unspecified type: Secondary | ICD-10-CM

## 2014-01-02 DIAGNOSIS — N760 Acute vaginitis: Secondary | ICD-10-CM | POA: Insufficient documentation

## 2014-01-02 DIAGNOSIS — Z113 Encounter for screening for infections with a predominantly sexual mode of transmission: Secondary | ICD-10-CM | POA: Insufficient documentation

## 2014-01-02 DIAGNOSIS — Z136 Encounter for screening for cardiovascular disorders: Secondary | ICD-10-CM

## 2014-01-02 DIAGNOSIS — Z309 Encounter for contraceptive management, unspecified: Secondary | ICD-10-CM

## 2014-01-02 DIAGNOSIS — Z01419 Encounter for gynecological examination (general) (routine) without abnormal findings: Secondary | ICD-10-CM | POA: Insufficient documentation

## 2014-01-02 DIAGNOSIS — Z0000 Encounter for general adult medical examination without abnormal findings: Secondary | ICD-10-CM

## 2014-01-02 LAB — LIPID PANEL
CHOL/HDL RATIO: 3
Cholesterol: 162 mg/dL (ref 0–200)
HDL: 58 mg/dL (ref >39.00)
LDL CALC: 81 mg/dL (ref 0–99)
TRIGLYCERIDES: 116 mg/dL (ref 0.0–149.0)
VLDL: 23.2 mg/dL (ref 0.0–40.0)

## 2014-01-02 LAB — COMPREHENSIVE METABOLIC PANEL
ALK PHOS: 55 U/L (ref 39–117)
ALT: 24 U/L (ref 0–35)
AST: 15 U/L (ref 0–37)
Albumin: 4.2 g/dL (ref 3.5–5.2)
BILIRUBIN TOTAL: 0.6 mg/dL (ref 0.3–1.2)
BUN: 12 mg/dL (ref 6–23)
CO2: 25 meq/L (ref 19–32)
CREATININE: 0.8 mg/dL (ref 0.4–1.2)
Calcium: 9.4 mg/dL (ref 8.4–10.5)
Chloride: 105 mEq/L (ref 96–112)
GFR: 102.77 mL/min (ref >60.00)
GLUCOSE: 94 mg/dL (ref 70–99)
Potassium: 3.7 mEq/L (ref 3.5–5.1)
SODIUM: 137 meq/L (ref 135–145)
TOTAL PROTEIN: 8 g/dL (ref 6.0–8.3)

## 2014-01-02 LAB — CBC WITH DIFFERENTIAL/PLATELET
BASOS ABS: 0 10*3/uL (ref 0.0–0.1)
Basophils Relative: 0.2 % (ref 0.0–3.0)
EOS ABS: 0.1 10*3/uL (ref 0.0–0.7)
Eosinophils Relative: 1.3 % (ref 0.0–5.0)
HCT: 40.1 % (ref 36.0–46.0)
HEMOGLOBIN: 13.5 g/dL (ref 12.0–15.0)
LYMPHS PCT: 38.9 % (ref 12.0–46.0)
Lymphs Abs: 4.4 10*3/uL — ABNORMAL HIGH (ref 0.7–4.0)
MCHC: 33.6 g/dL (ref 30.0–36.0)
MCV: 87.8 fl (ref 78.0–100.0)
MONOS PCT: 5.5 % (ref 3.0–12.0)
Monocytes Absolute: 0.6 10*3/uL (ref 0.1–1.0)
NEUTROS ABS: 6.1 10*3/uL (ref 1.4–7.7)
NEUTROS PCT: 54.1 % (ref 43.0–77.0)
Platelets: 378 10*3/uL (ref 150.0–400.0)
RBC: 4.57 Mil/uL (ref 3.87–5.11)
RDW: 12.8 % (ref 11.5–14.6)
WBC: 11.3 10*3/uL — ABNORMAL HIGH (ref 4.5–10.5)

## 2014-01-02 LAB — TSH: TSH: 1.1 u[IU]/mL (ref 0.35–5.50)

## 2014-01-02 MED ORDER — NORETHINDRONE ACET-ETHINYL EST 1.5-30 MG-MCG PO TABS: ORAL_TABLET | ORAL | Status: DC

## 2014-01-02 NOTE — Assessment & Plan Note (Signed)
Reviewed preventive care protocols, scheduled due services, and updated immunizations Discussed nutrition, exercise, diet, and healthy lifestyle. Orders Placed This Encounter  Procedures  . HIV Antibody  . RPR  . Lipid Panel  . CBC with Differential  . TSH  . Comprehensive metabolic panel

## 2014-01-02 NOTE — Patient Instructions (Signed)
Great to see you. We will call you with your lab results and you can view them online. Good luck with your job search- keep us updated!

## 2014-01-02 NOTE — Assessment & Plan Note (Signed)
Pap smear done today

## 2014-01-02 NOTE — Assessment & Plan Note (Signed)
eRx for Junel sent to her pharmacy.

## 2014-01-02 NOTE — Progress Notes (Signed)
23 yo pleasant female here for CPX.  Has never had a pap smear.  Contraceptive management-On Junel. Currently sexually active with one partner.  No mid cycle spotting. Denies any dysuria or abnormal vaginal discharge.   ADHD- per pt, formal evaluation and diagnosis in 4th grade. On Adderall 30 twice daily.  Takes it year round.  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  Just graduated from nursing school.  Wants to work in derm clinic.  [employer].  Patient Active Problem List   Diagnosis Date Noted  . Encounter for routine gynecological examination 01/02/2014  . Routine general medical examination at a health care facility 11/01/2012  . Situational anxiety 11/01/2012  . Test anxiety 12/05/2011  . Contraception management 11/03/2011  . Lower back pain 06/24/2011  . ADHD (attention deficit hyperactivity disorder)   . Acne    Past Medical History  Diagnosis Date  . ADHD (attention deficit hyperactivity disorder)   . Tinea versicolor   . Acne    No past surgical history on file. History  Substance Use Topics  . Smoking status: Never Smoker   . Smokeless tobacco: Not on file  . Alcohol Use: No   No Known Allergies  The PMH, PSH, Social History, Family History, Medications, and [facility] and have been updated if relevant.  ROS: See HPI Patient reports no  vision/ hearing changes,anorexia, weight change, fever ,adenopathy, persistant / recurrent hoarseness, swallowing issues, chest pain, edema,persistant / recurrent cough, hemoptysis, dyspnea(rest, exertional, paroxysmal nocturnal), gastrointestinal  bleeding (melena, rectal bleeding), abdominal pain, excessive heart burn, GU symptoms(dysuria, hematuria, pyuria, voiding/incontinence  Issues) syncope, focal weakness, severe memory loss, depression, anxiety, abnormal bruising/bleeding, major joint swelling, breast masses or abnormal vaginal bleeding.    Physical  exam: BP 102/62  Pulse 90  Temp(Src) 98 F (36.7 C) (Oral)  Ht 5\' 6"  (1.676 m)  Wt 141 lb 4 oz (64.071 kg)  BMI 22.81 kg/m2  SpO2 98%  LMP 12/25/2013  Wt Readings from Last 3 Encounters:  12/20/13 146 lb (66.225 kg)  12/21/12 144 lb (65.318 kg)  11/01/12 146 lb (66.225 kg)   General:  Well-developed,well-nourished,in no acute distress; alert,appropriate and cooperative throughout examination Head:  normocephalic and atraumatic.   Eyes:  vision grossly intact, pupils equal, pupils round, and pupils reactive to light.   Ears:  R ear normal and L ear normal.   Nose:  no external deformity.   Mouth:  good dentition.   Neck:  No deformities, masses, or tenderness noted. Breasts:  No mass, nodules, thickening, tenderness, bulging, retraction, inflamation, nipple discharge or skin changes noted.   Lungs:  Normal respiratory effort, chest expands symmetrically. Lungs are clear to auscultation, no crackles or wheezes. Heart:  Normal rate and regular rhythm. S1 and S2 normal without gallop, murmur, click, rub or other extra sounds. Abdomen:  Bowel sounds positive,abdomen soft and non-tender without masses, organomegaly or hernias noted. Rectal:  no external abnormalities.   Genitalia:  Pelvic Exam:        External: normal female genitalia without lesions or masses        Vagina: normal without lesions or masses        Cervix: normal without lesions or masses        Adnexa: normal bimanual exam without masses or fullness        Uterus: normal by palpation        Pap smear: performed Msk:  No deformity  or scoliosis noted of thoracic or lumbar spine.   Extremities:  No clubbing, cyanosis, edema, or deformity noted with normal full range of motion of all joints.   Neurologic:  alert & oriented X3 and gait normal.   Skin:  Intact without suspicious lesions or rashes Cervical Nodes:  No lymphadenopathy noted Axillary Nodes:  No palpable lymphadenopathy Psych:  Cognition and judgment appear  intact. Alert and cooperative with normal attention span and concentration. No apparent delusions, illusions, hallucinations  Assessment and Plan:

## 2014-01-02 NOTE — Progress Notes (Signed)
Pre visit review using our clinic review tool, if applicable. No additional management support is needed unless otherwise documented below in the visit note. 

## 2014-01-03 LAB — HIV ANTIBODY (ROUTINE TESTING W REFLEX): HIV: NONREACTIVE

## 2014-01-03 LAB — RPR

## 2014-01-06 LAB — CERVICOVAGINAL ANCILLARY ONLY: HERPES (WINDOWPATH): NEGATIVE

## 2014-01-06 MED ORDER — AZITHROMYCIN 1 G PO PACK: 1.0000 g | PACK | Freq: Once | ORAL | Status: DC

## 2014-01-06 MED ORDER — FLUCONAZOLE 150 MG PO TABS
150.0000 mg | ORAL_TABLET | Freq: Once | ORAL | Status: DC
Start: 2014-01-06 — End: 2015-07-24

## 2014-01-06 NOTE — Addendum Note (Signed)
Addended by: Desmond DikeKNIGHT, Nicol Herbig H on: 01/06/2014 02:22 PM   Modules accepted: Orders

## 2014-02-01 ENCOUNTER — Other Ambulatory Visit: Payer: Self-pay | Admitting: Internal Medicine

## 2014-03-11 ENCOUNTER — Other Ambulatory Visit: Payer: Self-pay

## 2014-03-11 NOTE — Telephone Encounter (Signed)
Pt left v/m requesting rx for Adderall. Call when ready for pick up.  

## 2014-03-12 ENCOUNTER — Other Ambulatory Visit: Payer: Self-pay | Admitting: Family Medicine

## 2014-03-12 ENCOUNTER — Other Ambulatory Visit: Payer: Self-pay | Admitting: Internal Medicine

## 2014-03-12 MED ORDER — AMPHETAMINE-DEXTROAMPHETAMINE 30 MG PO TABS: 30.0000 mg | ORAL_TABLET | Freq: Two times a day (BID) | ORAL | Status: DC

## 2014-03-12 NOTE — Telephone Encounter (Signed)
Lm on pts vm informing her Rx is available for pickup; informed a govt issued photo id required for pickup. Pt to complete UDS

## 2014-03-27 ENCOUNTER — Encounter: Payer: Self-pay | Admitting: Family Medicine

## 2014-04-14 ENCOUNTER — Encounter: Payer: Self-pay | Admitting: Family Medicine

## 2014-06-13 ENCOUNTER — Other Ambulatory Visit: Payer: Self-pay

## 2014-06-13 NOTE — Telephone Encounter (Signed)
Pt left v/m requesting rx for Adderall. Call when ready for pick up.  

## 2014-06-16 MED ORDER — AMPHETAMINE-DEXTROAMPHETAMINE 30 MG PO TABS: 30.0000 mg | ORAL_TABLET | Freq: Two times a day (BID) | ORAL | Status: DC

## 2014-06-16 NOTE — Telephone Encounter (Signed)
Lm on pts vm informing her Rx is available for pickup at the front desk 

## 2014-11-17 ENCOUNTER — Other Ambulatory Visit: Payer: Self-pay

## 2014-11-17 MED ORDER — AMPHETAMINE-DEXTROAMPHETAMINE 30 MG PO TABS: 30.0000 mg | ORAL_TABLET | Freq: Two times a day (BID) | ORAL | Status: DC

## 2014-11-17 NOTE — Telephone Encounter (Signed)
Detailed message left on cell phone that written prescription is up front for pickup.

## 2014-11-17 NOTE — Telephone Encounter (Signed)
Pt left v/m requesting rx for Adderall. Call when ready for pick up. Pt last seen 01/02/2014 and med last filled 06/16/14. No future appt scheduled.

## 2015-02-11 ENCOUNTER — Other Ambulatory Visit: Payer: Self-pay | Admitting: Family Medicine

## 2015-03-11 ENCOUNTER — Other Ambulatory Visit: Payer: Self-pay

## 2015-03-11 NOTE — Telephone Encounter (Signed)
Pt left v/m requesting rx for Adderall. Call when ready for pick up. Pt last seen annual exam 01/02/2014; no future appt scheduled. Last rx printed 11/17/14.Please advise.

## 2015-03-12 ENCOUNTER — Encounter: Payer: Self-pay | Admitting: Family Medicine

## 2015-03-12 MED ORDER — AMPHETAMINE-DEXTROAMPHETAMINE 30 MG PO TABS: 30.0000 mg | ORAL_TABLET | Freq: Two times a day (BID) | ORAL | Status: DC

## 2015-03-12 NOTE — Telephone Encounter (Signed)
Spoke to pt and informed her Rx is available for pickup from the front desk. Pt advised third party unable to pickup 

## 2015-04-07 ENCOUNTER — Encounter: Payer: Self-pay | Admitting: Family Medicine

## 2015-07-24 ENCOUNTER — Ambulatory Visit (INDEPENDENT_AMBULATORY_CARE_PROVIDER_SITE_OTHER): Payer: BLUE CROSS/BLUE SHIELD | Admitting: Internal Medicine

## 2015-07-24 ENCOUNTER — Encounter: Payer: Self-pay | Admitting: Internal Medicine

## 2015-07-24 VITALS — BP 96/52 | HR 78 | Temp 98.0°F | Wt 146.5 lb

## 2015-07-24 DIAGNOSIS — F418 Other specified anxiety disorders: Secondary | ICD-10-CM | POA: Diagnosis not present

## 2015-07-24 MED ORDER — ALPRAZOLAM 0.25 MG PO TABS: 0.2500 mg | ORAL_TABLET | Freq: Two times a day (BID) | ORAL | Status: DC | PRN

## 2015-07-24 NOTE — Progress Notes (Signed)
Subjective:    Patient ID: Mercedes Kramer, female    DOB: [birth year], 24 y.o.   MRN: [id number]  HPI  Pt presents to the clinic today with c/o anxiety. This has been going on for a while. It is not worse but she reports she is just having a harder time dealing with it. Her anxiety is triggered by multiple things. She just started a new job, and she is trying to get adjusted and into a routine there. 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 She is also getting married in 2 weeks. The wedding/planning is not making her anxious but she is gong to [place] on her honey moon and she is afraid to fly. She does not have panic attacks. She has not history of depression. She reports she was treated for anxiety a few years ago with Propanolol but the medication did not seem to be effective. She denies SI/HI.   Review of Systems      Past Medical History  Diagnosis Date  . ADHD (attention deficit hyperactivity disorder)   . Tinea versicolor   . Acne     Current Outpatient Prescriptions  Medication Sig Dispense Refill  . amphetamine-dextroamphetamine (ADDERALL) 30 MG tablet Take 1 tablet by mouth 2 (two) times daily. 60 tablet 0  . azithromycin (ZITHROMAX) 1 G powder Take 1 packet (1 g total) by mouth once. 1 each 0  . fluconazole (DIFLUCAN) 150 MG tablet Take 1 tablet (150 mg total) by mouth once. 1 tablet 0  . JUNEL 1.5/30 1.5-30 MG-MCG tablet TAKE 1 TABLET EVERY DAY 21 tablet 0  . propranolol (INDERAL) 20 MG tablet Take 1 Tablet by mouth 30 to 60 minutes prior to public performance as directed 30 tablet 0  . propranolol (INDERAL) 20 MG tablet TAKE 1 TABLET BY MOUTH 30 TO 60 MINUTES PRIOR TO PUBLIC PERFORMANCE AS DIRECTED 30 tablet 1   No current facility-administered medications for this visit.   Facility-Administered Medications Ordered in Other Visits  Medication Dose Route Frequency Provider Last Rate Last Dose  . methylPREDNISolone acetate (DEPO-MEDROL)  injection 80 mg  80 mg Intramuscular Once Lorre Munroe, NP        No Known Allergies  No family history on file.  Social History   Social History  . Marital Status: Single    Spouse Name: N/A  . Number of Children: N/A  . Years of Education: N/A   Occupational History  . Not on file.   Social History Main Topics  . Smoking status: Never Smoker   . Smokeless tobacco: Not on file  . Alcohol Use: No  . Drug Use: No  . Sexual Activity:    Partners: Male    Birth Control/ Protection: OCP   Other Topics Concern  . Not on file   Social History Narrative   CNA     Constitutional: Denies fever, malaise, fatigue, headache or abrupt weight changes.  Respiratory: Denies difficulty breathing, shortness of breath, cough or sputum production.   Cardiovascular: Denies chest pain, chest tightness, palpitations or swelling in the hands or feet.  Psych: Pt reports anxiety. Denies depression, SI/HI.  No other specific complaints in a complete review of systems (except as listed in HPI above).  Objective:   Physical Exam   BP 96/52 mmHg  Pulse 78  Temp(Src) 98 F (36.7 C) (Oral)  Wt 146 lb 8 oz (66.452 kg)  SpO2 97%  LMP 06/23/2015 (Within Days) Wt Readings  from Last 3 Encounters:  07/24/15 146 lb 8 oz (66.452 kg)  01/02/14 141 lb 4 oz (64.071 kg)  12/20/13 146 lb (66.225 kg)    General: Appears her stated age, well developed, well nourished in NAD. Cardiovascular: Normal rate and rhythm. S1,S2 noted.  No murmur, rubs or gallops noted.  Pulmonary/Chest: Normal effort and positive vesicular breath sounds. No respiratory distress. No wheezes, rales or ronchi noted.  Neurological: Alert and oriented.  Psychiatric: She does make eye contact and engage. She does seem mildly anxious today.  BMET    Component Value Date/Time   NA 137 01/02/2014 0805   K 3.7 01/02/2014 0805   CL 105 01/02/2014 0805   CO2 25 01/02/2014 0805   GLUCOSE 94 01/02/2014 0805   BUN 12 01/02/2014  0805   CREATININE 0.8 01/02/2014 0805   CALCIUM 9.4 01/02/2014 0805    Lipid Panel     Component Value Date/Time   CHOL 162 01/02/2014 0805   TRIG 116.0 01/02/2014 0805   HDL 58.00 01/02/2014 0805   CHOLHDL 3 01/02/2014 0805   VLDL 23.2 01/02/2014 0805   LDLCALC 81 01/02/2014 0805    CBC    Component Value Date/Time   WBC 11.3* 01/02/2014 0805   RBC 4.57 01/02/2014 0805   HGB 13.5 01/02/2014 0805   HCT 40.1 01/02/2014 0805   PLT 378.0 01/02/2014 0805   MCV 87.8 01/02/2014 0805   MCHC 33.6 01/02/2014 0805   RDW 12.8 01/02/2014 0805   LYMPHSABS 4.4* 01/02/2014 0805   MONOABS 0.6 01/02/2014 0805   EOSABS 0.1 01/02/2014 0805   BASOSABS 0.0 01/02/2014 0805    Hgb A1C No results found for: HGBA1C      Assessment & Plan:   Situational Anxiety:  Support offered today Discussed treatment options I feel like she would benefit more from prn low dose benzo versus SSRI therapy Discussed addiction/sedation potential  Discussed stopping medication if she learns she is pregnant (currently on OCP) eRx for Xanax 0.25 mg BID prn  Follow up with PCP in 3 months or sooner if needed

## 2015-07-24 NOTE — Patient Instructions (Signed)
Generalized Anxiety Disorder Generalized anxiety disorder (GAD) is a mental disorder. It interferes with life functions, including relationships, work, and school. GAD is different from normal anxiety, which everyone experiences at some point in their lives in response to specific life events and activities. Normal anxiety actually helps us prepare for and get through these life events and activities. Normal anxiety goes away after the event or activity is over.  GAD causes anxiety that is not necessarily related to specific events or activities. It also causes excess anxiety in proportion to specific events or activities. The anxiety associated with GAD is also difficult to control. GAD can vary from mild to severe. People with severe GAD can have intense waves of anxiety with physical symptoms (panic attacks).  SYMPTOMS The anxiety and worry associated with GAD are difficult to control. This anxiety and worry are related to many life events and activities and also occur more days than not for 6 months or longer. People with GAD also have three or more of the following symptoms (one or more in children):  Restlessness.   Fatigue.  Difficulty concentrating.   Irritability.  Muscle tension.  Difficulty sleeping or unsatisfying sleep. DIAGNOSIS GAD is diagnosed through an assessment by your health care provider. Your health care provider will ask you questions aboutyour mood,physical symptoms, and events in your life. Your health care provider may ask you about your medical history and use of alcohol or drugs, including prescription medicines. Your health care provider may also do a physical exam and blood tests. Certain medical conditions and the use of certain substances can cause symptoms similar to those associated with GAD. Your health care provider may refer you to a mental health specialist for further evaluation. TREATMENT The following therapies are usually used to treat GAD:    Medication. Antidepressant medication usually is prescribed for long-term daily control. Antianxiety medicines may be added in severe cases, especially when panic attacks occur.   Talk therapy (psychotherapy). Certain types of talk therapy can be helpful in treating GAD by providing support, education, and guidance. A form of talk therapy called cognitive behavioral therapy can teach you healthy ways to think about and react to daily life events and activities.  Stress managementtechniques. These include yoga, meditation, and exercise and can be very helpful when they are practiced regularly. A mental health specialist can help determine which treatment is best for you. Some people see improvement with one therapy. However, other people require a combination of therapies. Document Released: 02/11/2013 Document Revised: 03/03/2014 Document Reviewed: 02/11/2013 Exit[employer] Patient Information 2015 [employer]. This information is not intended to replace advice given to you by your health care provider. Make sure you discuss any questions you have with your health care provider.  

## 2015-07-24 NOTE — Progress Notes (Signed)
Pre visit review using our clinic review tool, if applicable. No additional management support is needed unless otherwise documented below in the visit note. 

## 2015-08-11 ENCOUNTER — Other Ambulatory Visit: Payer: Self-pay

## 2015-08-11 MED ORDER — AMPHETAMINE-DEXTROAMPHETAMINE 30 MG PO TABS: 30.0000 mg | ORAL_TABLET | Freq: Two times a day (BID) | ORAL | Status: DC

## 2015-08-11 NOTE — Telephone Encounter (Signed)
Pt left v/m requesting rx for Adderall. Call when ready for pick up. rx last printed #60 on 03/12/15. Last annual exam 01/02/2014. Last seen 07/24/15. No future appt scheduled.

## 2015-08-11 NOTE — Telephone Encounter (Signed)
Patient's mom notified that script is up front ready for pickup.

## 2015-08-16 ENCOUNTER — Other Ambulatory Visit: Payer: Self-pay | Admitting: Family Medicine

## 2015-08-17 NOTE — Telephone Encounter (Signed)
Lm on pts vm and informed her OV required for additional refills 

## 2015-09-03 ENCOUNTER — Other Ambulatory Visit: Payer: Self-pay | Admitting: Family Medicine

## 2015-09-30 ENCOUNTER — Ambulatory Visit (INDEPENDENT_AMBULATORY_CARE_PROVIDER_SITE_OTHER): Payer: BLUE CROSS/BLUE SHIELD | Admitting: Family Medicine

## 2015-09-30 ENCOUNTER — Telehealth: Payer: Self-pay | Admitting: Family Medicine

## 2015-09-30 ENCOUNTER — Encounter: Payer: Self-pay | Admitting: Family Medicine

## 2015-09-30 VITALS — BP 114/66 | HR 83 | Temp 98.0°F | Wt 149.5 lb

## 2015-09-30 DIAGNOSIS — N644 Mastodynia: Secondary | ICD-10-CM | POA: Diagnosis not present

## 2015-09-30 LAB — HCG, QUANTITATIVE, PREGNANCY: Quantitative HCG: 0.01 m[IU]/mL

## 2015-09-30 NOTE — Telephone Encounter (Signed)
Pt did not have xray. See results note

## 2015-09-30 NOTE — Assessment & Plan Note (Signed)
New- exam unremarkable. U preg negative here as well- serum hcg to confirm. Ultrasound of breasts for further evaluation. The patient indicates understanding of these issues and agrees with the plan.

## 2015-09-30 NOTE — Progress Notes (Signed)
Subjective:   Patient ID: Mercedes Kramer, female    DOB: Feb 19, 1991, 24 y.o.   MRN: [id number]  Mercedes Kramer is a pleasant 24 y.o. year old female who presents to clinic today with Breast Pain  on 09/30/2015  HPI:  Almost two months of progressive, bilateral breast pain. Pain is intermittent- "shooting" Breast feels larger as well. No nausea, vomiting or fatigue.  She is unsure exactly when last period was- maybe a little over a month ago. Admits to forgetting her OCPs a few times recently. Home urine pregnancy test negative x 2.  Has not felt any lumps or bumps in her breast. No discharge from her nipples.  Current Outpatient Prescriptions on File Prior to Visit  Medication Sig Dispense Refill  . ALPRAZolam (XANAX) 0.25 MG tablet Take 1 tablet (0.25 mg total) by mouth 2 (two) times daily as needed for anxiety. 20 tablet 0  . amphetamine-dextroamphetamine (ADDERALL) 30 MG tablet Take 1 tablet by mouth 2 (two) times daily. 60 tablet 0  . JUNEL 1.5/30 1.5-30 MG-MCG tablet TAKE 1 TABLET EVERY DAY 21 tablet 0   Current Facility-Administered Medications on File Prior to Visit  Medication Dose Route Frequency Provider Last Rate Last Dose  . methylPREDNISolone acetate (DEPO-MEDROL) injection 80 mg  80 mg Intramuscular Once Lorre Munroe, NP        No Known Allergies  Past Medical History  Diagnosis Date  . ADHD (attention deficit hyperactivity disorder)   . Tinea versicolor   . Acne     No past surgical history on file.  No family history on file.  Social History   Social History  . Marital Status: Single    Spouse Name: N/A  . Number of Children: N/A  . Years of Education: N/A   Occupational History  . Not on file.   Social History Main Topics  . Smoking status: Never Smoker   . Smokeless tobacco: Not on file  . Alcohol Use: No  . Drug Use: No  . Sexual Activity:    Partners: Male    Birth Control/ Protection: OCP   Other Topics Concern  . Not on file    Social History Narrative   CNA   The PMH, PSH, Social History, Family History, Medications, and allergies have been reviewed in [facility], and have been updated if relevant.   Review of Systems  Constitutional: Negative.   Respiratory: Negative.   Cardiovascular: Negative.   Gastrointestinal: Negative.   Genitourinary: Negative.   Musculoskeletal: Negative.   Skin: Negative.   Neurological: Negative.   Psychiatric/Behavioral: Negative.   All other systems reviewed and are negative.      Objective:    BP 114/66 mmHg  Pulse 83  Temp(Src) 98 F (36.7 C) (Oral)  Wt 149 lb 8 oz (67.813 kg)  SpO2 98%  LMP 09/21/2015   Physical Exam  Constitutional: She is oriented to person, place, and time. She appears well-developed and well-nourished. No distress.  HENT:  Head: Normocephalic.  Eyes: Conjunctivae are normal.  Cardiovascular: Normal rate.   Pulmonary/Chest: Effort normal. Right breast exhibits no inverted nipple, no mass, no nipple discharge, no skin change and no tenderness. Left breast exhibits no inverted nipple, no mass, no nipple discharge, no skin change and no tenderness. Breasts are symmetrical.  Musculoskeletal: Normal range of motion.  Neurological: She is alert and oriented to person, place, and time. No cranial nerve deficit.  Skin: Skin is warm and dry.  Psychiatric: She has a normal mood  and affect. Her behavior is normal. Judgment and thought content normal.  Nursing note and vitals reviewed.         Assessment & Plan:   No diagnosis found. No Follow-up on file.

## 2015-09-30 NOTE — Telephone Encounter (Signed)
Pt would a return cal regarding xray results mf  cb number is [phone] Thank you

## 2015-09-30 NOTE — Progress Notes (Signed)
Pre visit review using our clinic review tool, if applicable. No additional management support is needed unless otherwise documented below in the visit note. 

## 2015-10-01 ENCOUNTER — Ambulatory Visit
Admission: RE | Admit: 2015-10-01 | Discharge: 2015-10-01 | Disposition: A | Discharge status: Home / Self Care | Payer: BLUE CROSS/BLUE SHIELD | Source: Ambulatory Visit | Attending: Family Medicine | Admitting: Family Medicine

## 2015-10-01 DIAGNOSIS — N644 Mastodynia: Secondary | ICD-10-CM | POA: Diagnosis not present

## 2015-10-01 DIAGNOSIS — Z803 Family history of malignant neoplasm of breast: Secondary | ICD-10-CM | POA: Diagnosis not present

## 2015-10-23 ENCOUNTER — Ambulatory Visit (INDEPENDENT_AMBULATORY_CARE_PROVIDER_SITE_OTHER): Payer: BLUE CROSS/BLUE SHIELD | Admitting: Internal Medicine

## 2015-10-23 ENCOUNTER — Encounter: Payer: Self-pay | Admitting: Internal Medicine

## 2015-10-23 VITALS — BP 110/60 | HR 78 | Temp 97.0°F | Wt 153.0 lb

## 2015-10-23 DIAGNOSIS — J069 Acute upper respiratory infection, unspecified: Secondary | ICD-10-CM | POA: Diagnosis not present

## 2015-10-23 MED ORDER — METHYLPREDNISOLONE ACETATE 40 MG/ML IJ SUSP
80.0000 mg | Freq: Once | INTRAMUSCULAR | Status: AC
  Administered 2015-10-23: 80 mg via INTRAMUSCULAR

## 2015-10-23 MED ORDER — HYDROCODONE-HOMATROPINE 5-1.5 MG/5ML PO SYRP: 5.0000 mL | ORAL_SOLUTION | Freq: Three times a day (TID) | ORAL | Status: DC | PRN

## 2015-10-23 MED ORDER — AZITHROMYCIN 250 MG PO TABS: ORAL_TABLET | ORAL | Status: DC

## 2015-10-23 NOTE — Progress Notes (Signed)
Pre visit review using our clinic review tool, if applicable. No additional management support is needed unless otherwise documented below in the visit note. 

## 2015-10-23 NOTE — Patient Instructions (Signed)

## 2015-10-23 NOTE — Addendum Note (Signed)
Addended by: Sueanne MargaritaSMITH, Theseus Birnie L on: 10/23/2015 02:41 PM   Modules accepted: Orders

## 2015-10-23 NOTE — Progress Notes (Signed)
HPI  Pt presents to the clinic today with c/o runny nose, sore throat and cough. This started 1 week ago. She is blowing clear mucous out of her nose. The cough is productive of clear mucous. She denies fever but has had chills and body aches. She has tried Dayquil and Nyquil with minimal relief. She has no history of seasonal allergies or breathing problems. She has had sick contacts.  Review of Systems      Past Medical History  Diagnosis Date  . ADHD (attention deficit hyperactivity disorder)   . Tinea versicolor   . Acne     No family history on file.  Social History   Social History  . Marital Status: Single    Spouse Name: N/A  . Number of Children: N/A  . Years of Education: N/A   Occupational History  . Not on file.   Social History Main Topics  . Smoking status: Never Smoker   . Smokeless tobacco: Not on file  . Alcohol Use: No  . Drug Use: No  . Sexual Activity:    Partners: Male    Birth Control/ Protection: OCP   Other Topics Concern  . 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  HEENT:  Positive runny nose, sore throat. Denies eye redness, eye pain, pressure behind the eyes, facial pain, nasal congestion, ear pain, ringing in the ears, wax buildup, runny nose or bloody nose. Respiratory: Positive cough. Denies difficulty breathing or shortness of breath.  Cardiovascular: Denies chest pain, chest tightness, palpitations or swelling in the hands or feet.   No other specific complaints in a complete review of systems (except as listed in HPI above).  Objective:   Blood pressure 110/60, pulse 78, temperature 97 F (36.1 C), temperature source Tympanic, weight 153 lb (69.4 kg), last menstrual period 09/21/2015, SpO2 98 %.  Wt Readings from Last 3 Encounters:  09/30/15 149 lb 8 oz (67.813 kg)  07/24/15 146 lb 8 oz (66.452 kg)  01/02/14 141 lb 4 oz  (64.071 kg)     General: Appears her stated age, well developed, well nourished in NAD. HEENT: Head: normal shape and size; Eyes: sclera white, no icterus, conjunctiva pink; Ears: Tm's gray and intact, normal light reflex; Nose: mucosa pink and moist, septum midline; Throat/Mouth: + PND. Teeth present, mucosa erythematous and moist, no exudate noted, no lesions or ulcerations noted.  Neck: No cervical lymphadenopathy.  Cardiovascular: Normal rate and rhythm. S1,S2 noted.  No murmur, rubs or gallops noted.  Pulmonary/Chest: Normal effort and positive vesicular breath sounds. No respiratory distress. No wheezes, rales or ronchi noted.      Assessment & Plan:   Upper Respiratory Infection:  Get some rest and drink plenty of water Do salt water gargles for the sore throat 80 mg Depo IM today eRx for Azithromax x 5 days Rx for Hycodan cough syrup  RTC as needed or if symptoms persist.

## 2016-01-08 ENCOUNTER — Other Ambulatory Visit: Payer: Self-pay | Admitting: Family Medicine

## 2016-02-04 ENCOUNTER — Other Ambulatory Visit: Payer: Self-pay

## 2016-02-04 NOTE — Telephone Encounter (Signed)
Ok to print and put on my desk for signature. 

## 2016-02-04 NOTE — Telephone Encounter (Signed)
Pt left v/m requesting rx for Adderall. Call when ready for pick up. Last printed # 60 on 08/11/15; last annual 01/02/14; last acute visit 10/23/15; no future appt scheduled.

## 2016-02-05 MED ORDER — AMPHETAMINE-DEXTROAMPHETAMINE 30 MG PO TABS: 30.0000 mg | ORAL_TABLET | Freq: Two times a day (BID) | ORAL | Status: DC

## 2016-02-05 NOTE — Telephone Encounter (Signed)
Rx placed in Dr Elmer SowAron's inbox for signature on Monday

## 2016-04-05 ENCOUNTER — Other Ambulatory Visit: Payer: Self-pay | Admitting: Family Medicine

## 2016-04-05 MED ORDER — NORETHINDRONE ACET-ETHINYL EST 1.5-30 MG-MCG PO TABS: 1.0000 | ORAL_TABLET | Freq: Every day | ORAL | Status: DC

## 2016-04-06 ENCOUNTER — Other Ambulatory Visit: Payer: Self-pay | Admitting: Internal Medicine

## 2016-04-06 DIAGNOSIS — Z0000 Encounter for general adult medical examination without abnormal findings: Secondary | ICD-10-CM

## 2016-04-13 ENCOUNTER — Other Ambulatory Visit: Payer: BLUE CROSS/BLUE SHIELD

## 2016-04-21 ENCOUNTER — Encounter: Payer: BLUE CROSS/BLUE SHIELD | Admitting: Family Medicine

## 2016-04-21 ENCOUNTER — Telehealth: Payer: Self-pay | Admitting: Family Medicine

## 2016-04-21 DIAGNOSIS — Z0289 Encounter for other administrative examinations: Secondary | ICD-10-CM

## 2016-04-21 NOTE — Telephone Encounter (Signed)
Patient did not come for their scheduled appointment today for cpx  Please let me know if the patient needs to be contacted immediately for follow up or if no follow up is necessary.   ° °

## 2016-05-02 ENCOUNTER — Telehealth: Payer: Self-pay | Admitting: Family Medicine

## 2016-05-02 NOTE — Telephone Encounter (Signed)
[facility]  Patient's asking to be called back with results.

## 2016-05-02 NOTE — Telephone Encounter (Signed)
dob 07/25/1991

## 2016-05-02 NOTE — Telephone Encounter (Signed)
Left message asking pt to call office Received paperwork from [facility] they have different dob then we have Please confirm patient dob  paperwork on robin's desk

## 2016-05-02 NOTE — Telephone Encounter (Signed)
Per carrie pt stated her dob 07/29/1991

## 2016-09-05 ENCOUNTER — Other Ambulatory Visit: Payer: Self-pay

## 2016-09-05 MED ORDER — AMPHETAMINE-DEXTROAMPHETAMINE 30 MG PO TABS: 30.0000 mg | ORAL_TABLET | Freq: Two times a day (BID) | ORAL | 0 refills | Status: DC

## 2016-09-05 NOTE — Telephone Encounter (Signed)
Pt left v/m requesting rx for Adderall. Call when ready for pick up. Last printed # 60 on 02/05/16. Last annual 01/12/14; no future appt scheduled.

## 2016-09-05 NOTE — Telephone Encounter (Signed)
Lm on pts vm and informed her Rx is available for pickup from the front desk. Pt will also need to schedule a f/u appt as she has not been seen for ADHD in 2+ yrs. Also written on envelope of Rx

## 2016-09-19 ENCOUNTER — Other Ambulatory Visit: Payer: Self-pay | Admitting: Family Medicine

## 2016-09-20 ENCOUNTER — Ambulatory Visit: Payer: BLUE CROSS/BLUE SHIELD | Admitting: Family Medicine

## 2016-10-06 ENCOUNTER — Ambulatory Visit: Payer: BLUE CROSS/BLUE SHIELD | Admitting: Family Medicine

## 2016-10-06 DIAGNOSIS — Z0289 Encounter for other administrative examinations: Secondary | ICD-10-CM

## 2016-10-07 ENCOUNTER — Telehealth: Payer: Self-pay | Admitting: Family Medicine

## 2016-10-07 DIAGNOSIS — M25521 Pain in right elbow: Secondary | ICD-10-CM | POA: Diagnosis not present

## 2016-10-07 DIAGNOSIS — R51 Headache: Secondary | ICD-10-CM | POA: Diagnosis not present

## 2016-10-07 DIAGNOSIS — S5001XA Contusion of right elbow, initial encounter: Secondary | ICD-10-CM | POA: Diagnosis not present

## 2016-10-07 DIAGNOSIS — S060X0A Concussion without loss of consciousness, initial encounter: Secondary | ICD-10-CM | POA: Diagnosis not present

## 2016-10-07 NOTE — Telephone Encounter (Signed)
Patient did not come in for their appointment on 12/ for med management  Please let me know if patient needs to be contacted immediately for follow up or no follow up needed.

## 2016-10-07 NOTE — Telephone Encounter (Signed)
Please call pt to see if she wants to reschedule.

## 2016-10-08 DIAGNOSIS — S060X0A Concussion without loss of consciousness, initial encounter: Secondary | ICD-10-CM | POA: Diagnosis not present

## 2016-10-08 DIAGNOSIS — R410 Disorientation, unspecified: Secondary | ICD-10-CM | POA: Diagnosis not present

## 2016-10-08 DIAGNOSIS — R51 Headache: Secondary | ICD-10-CM | POA: Diagnosis not present

## 2016-10-08 DIAGNOSIS — S06360A Traumatic hemorrhage of cerebrum, unspecified, without loss of consciousness, initial encounter: Secondary | ICD-10-CM | POA: Diagnosis not present

## 2016-10-08 DIAGNOSIS — S5001XA Contusion of right elbow, initial encounter: Secondary | ICD-10-CM | POA: Diagnosis not present

## 2016-10-08 DIAGNOSIS — S098XXA Other specified injuries of head, initial encounter: Secondary | ICD-10-CM | POA: Diagnosis not present

## 2016-10-08 DIAGNOSIS — R42 Dizziness and giddiness: Secondary | ICD-10-CM | POA: Diagnosis not present

## 2016-10-10 DIAGNOSIS — R2681 Unsteadiness on feet: Secondary | ICD-10-CM | POA: Diagnosis not present

## 2016-10-10 DIAGNOSIS — S060X0A Concussion without loss of consciousness, initial encounter: Secondary | ICD-10-CM | POA: Diagnosis not present

## 2016-10-10 DIAGNOSIS — R278 Other lack of coordination: Secondary | ICD-10-CM | POA: Diagnosis not present

## 2016-10-10 DIAGNOSIS — R2689 Other abnormalities of gait and mobility: Secondary | ICD-10-CM | POA: Diagnosis not present

## 2016-10-10 DIAGNOSIS — F0781 Postconcussional syndrome: Secondary | ICD-10-CM | POA: Diagnosis not present

## 2016-10-10 DIAGNOSIS — R51 Headache: Secondary | ICD-10-CM | POA: Diagnosis not present

## 2016-10-12 DIAGNOSIS — F411 Generalized anxiety disorder: Secondary | ICD-10-CM | POA: Diagnosis not present

## 2016-10-12 DIAGNOSIS — R42 Dizziness and giddiness: Secondary | ICD-10-CM | POA: Diagnosis not present

## 2016-10-12 DIAGNOSIS — F0781 Postconcussional syndrome: Secondary | ICD-10-CM | POA: Diagnosis not present

## 2016-10-12 DIAGNOSIS — Z79899 Other long term (current) drug therapy: Secondary | ICD-10-CM | POA: Diagnosis not present

## 2016-10-12 DIAGNOSIS — F419 Anxiety disorder, unspecified: Secondary | ICD-10-CM | POA: Diagnosis not present

## 2016-10-14 DIAGNOSIS — R42 Dizziness and giddiness: Secondary | ICD-10-CM | POA: Diagnosis not present

## 2016-10-14 DIAGNOSIS — R51 Headache: Secondary | ICD-10-CM | POA: Diagnosis not present

## 2016-10-14 DIAGNOSIS — R260 Ataxic gait: Secondary | ICD-10-CM | POA: Diagnosis not present

## 2016-10-14 DIAGNOSIS — S060X1A Concussion with loss of consciousness of 30 minutes or less, initial encounter: Secondary | ICD-10-CM | POA: Diagnosis not present

## 2016-10-14 DIAGNOSIS — Y33XXXA Other specified events, undetermined intent, initial encounter: Secondary | ICD-10-CM | POA: Diagnosis not present

## 2016-10-20 DIAGNOSIS — I951 Orthostatic hypotension: Secondary | ICD-10-CM | POA: Diagnosis not present

## 2016-11-01 ENCOUNTER — Encounter: Payer: Self-pay | Admitting: Physical Therapy

## 2016-11-01 ENCOUNTER — Ambulatory Visit: Payer: No Typology Code available for payment source | Attending: Neurology | Admitting: Physical Therapy

## 2016-11-01 VITALS — BP 111/69

## 2016-11-01 DIAGNOSIS — R262 Difficulty in walking, not elsewhere classified: Secondary | ICD-10-CM | POA: Insufficient documentation

## 2016-11-01 DIAGNOSIS — R42 Dizziness and giddiness: Secondary | ICD-10-CM | POA: Diagnosis not present

## 2016-11-01 DIAGNOSIS — R2681 Unsteadiness on feet: Secondary | ICD-10-CM | POA: Diagnosis not present
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
## 2016-11-03 ENCOUNTER — Other Ambulatory Visit: Payer: Self-pay | Admitting: Family Medicine

## 2016-11-11 ENCOUNTER — Encounter: Payer: Self-pay | Admitting: Physical Therapy

## 2016-11-11 ENCOUNTER — Ambulatory Visit: Payer: No Typology Code available for payment source | Admitting: Physical Therapy

## 2016-11-11 DIAGNOSIS — R2681 Unsteadiness on feet: Secondary | ICD-10-CM

## 2016-11-11 DIAGNOSIS — R262 Difficulty in walking, not elsewhere classified: Secondary | ICD-10-CM | POA: Diagnosis not present

## 2016-11-11 DIAGNOSIS — R42 Dizziness and giddiness: Secondary | ICD-10-CM
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
## 2016-11-15 ENCOUNTER — Encounter: Payer: Self-pay | Admitting: Physical Therapy

## 2016-11-15 ENCOUNTER — Ambulatory Visit: Payer: No Typology Code available for payment source | Admitting: Physical Therapy

## 2016-11-15 DIAGNOSIS — R42 Dizziness and giddiness: Secondary | ICD-10-CM | POA: Diagnosis not present

## 2016-11-15 DIAGNOSIS — R2681 Unsteadiness on feet: Secondary | ICD-10-CM

## 2016-11-15 DIAGNOSIS — R262 Difficulty in walking, not elsewhere classified: Secondary | ICD-10-CM | POA: Diagnosis not present
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
## 2016-11-18 ENCOUNTER — Encounter: Payer: BLUE CROSS/BLUE SHIELD | Admitting: Physical Therapy

## 2016-11-25 ENCOUNTER — Ambulatory Visit: Payer: No Typology Code available for payment source | Admitting: Physical Therapy

## 2016-12-09 ENCOUNTER — Encounter: Payer: Self-pay | Admitting: Physical Therapy

## 2016-12-09 ENCOUNTER — Ambulatory Visit: Payer: No Typology Code available for payment source | Attending: Neurology | Admitting: Physical Therapy

## 2016-12-09 DIAGNOSIS — R2681 Unsteadiness on feet: Secondary | ICD-10-CM | POA: Diagnosis not present

## 2016-12-09 DIAGNOSIS — R42 Dizziness and giddiness: Secondary | ICD-10-CM | POA: Insufficient documentation

## 2016-12-09 DIAGNOSIS — R262 Difficulty in walking, not elsewhere classified: Secondary | ICD-10-CM | POA: Insufficient documentation
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
## 2016-12-30 ENCOUNTER — Ambulatory Visit: Payer: BLUE CROSS/BLUE SHIELD | Attending: Neurology | Admitting: Physical Therapy

## 2017-03-30 ENCOUNTER — Ambulatory Visit: Payer: BLUE CROSS/BLUE SHIELD | Admitting: Family Medicine

## 2017-03-30 ENCOUNTER — Other Ambulatory Visit (INDEPENDENT_AMBULATORY_CARE_PROVIDER_SITE_OTHER): Payer: BLUE CROSS/BLUE SHIELD

## 2017-03-30 VITALS — BP 100/74 | HR 74 | Ht 66.5 in | Wt 149.0 lb

## 2017-03-30 DIAGNOSIS — Z0000 Encounter for general adult medical examination without abnormal findings: Secondary | ICD-10-CM | POA: Diagnosis not present

## 2017-03-30 MED ORDER — AMPHETAMINE-DEXTROAMPHETAMINE 30 MG PO TABS: 30.0000 mg | ORAL_TABLET | Freq: Two times a day (BID) | ORAL | 0 refills | Status: DC

## 2017-03-30 MED ORDER — NORETHINDRONE ACET-ETHINYL EST 1.5-30 MG-MCG PO TABS: 1.0000 | ORAL_TABLET | Freq: Every day | ORAL | 0 refills | Status: DC

## 2017-03-30 NOTE — Progress Notes (Unsigned)
   Subjective:   Patient ID: Mercedes Kramer, female    DOB: 07/18/1991, 26 y.o.   MRN: [id number]  Mercedes Kramer is a pleasant 26 y.o. year old female who presents to clinic today with Annual Exam  on 03/30/2017  HPI:   Last pap smear 01/02/14- done by me.  Current Outpatient Prescriptions on File Prior to Visit  Medication Sig Dispense Refill  . amphetamine-dextroamphetamine (ADDERALL) 30 MG tablet Take 1 tablet by mouth 2 (two) times daily. (Patient not taking: Reported on 11/01/2016) 60 tablet 0  . azithromycin (ZITHROMAX) 250 MG tablet Take 2 tabs today, then 1 tab daily x 4 days (Patient not taking: Reported on 11/01/2016) 6 tablet 0  . HYDROcodone-homatropine (HYCODAN) 5-1.5 MG/5ML syrup Take 5 mLs by mouth every 8 (eight) hours as needed for cough. (Patient not taking: Reported on 11/01/2016) 120 mL 0  . Norethindrone Acetate-Ethinyl Estradiol (JUNEL 1.5/30) 1.5-30 MG-MCG tablet Take 1 tablet by mouth daily. 21 tablet 0   No current facility-administered medications on file prior to visit.     No Known Allergies  Past Medical History:  Diagnosis Date  . Acne   . ADHD (attention deficit hyperactivity disorder)   . Tinea versicolor     No past surgical history on file.  No family history on file.  Social History   Social History  . Marital status: Single    Spouse name: N/A  . Number of children: N/A  . Years of education: N/A   Occupational History  . Not on file.   Social History Main Topics  . Smoking status: Never Smoker  . Smokeless tobacco: Never Used  . Alcohol use No  . Drug use: No  . Sexual activity: Yes    Partners: Male    Birth control/ protection: OCP   Other Topics Concern  . Not on file   Social History Narrative   CNA   The PMH, PSH, Social History, Family History, Medications, and allergies [facility], and have been updated if relevant.   Review of Systems     Objective:    There were no vitals taken for this  visit.   Physical Exam        Assessment & Plan:   Routine general medical examination at a health care facility No Follow-up on file.

## 2017-03-30 NOTE — Addendum Note (Signed)
Addended by: Baldomero LamyHAVERS, Desare Duddy C on: 03/30/2017 01:42 PM   Modules accepted: Orders

## 2017-03-31 LAB — COMPREHENSIVE METABOLIC PANEL
ALK PHOS: 63 IU/L (ref 39–117)
ALT: 16 IU/L (ref 0–32)
AST: 11 IU/L (ref 0–40)
Albumin/Globulin Ratio: 1.8 (ref 1.2–2.2)
Albumin: 4.9 g/dL (ref 3.5–5.5)
BILIRUBIN TOTAL: 0.4 mg/dL (ref 0.0–1.2)
BUN/Creatinine Ratio: 14 (ref 9–23)
BUN: 11 mg/dL (ref 6–20)
CHLORIDE: 102 mmol/L (ref 96–106)
CO2: 26 mmol/L (ref 18–29)
Calcium: 9.8 mg/dL (ref 8.7–10.2)
Creatinine, Ser: 0.81 mg/dL (ref 0.57–1.00)
GFR calc Af Amer: 117 mL/min/{1.73_m2} (ref >59)
GFR calc non Af Amer: 101 mL/min/{1.73_m2} (ref >59)
GLUCOSE: 91 mg/dL (ref 65–99)
Globulin, Total: 2.7 g/dL (ref 1.5–4.5)
Potassium: 4.5 mmol/L (ref 3.5–5.2)
Sodium: 141 mmol/L (ref 134–144)
Total Protein: 7.6 g/dL (ref 6.0–8.5)

## 2017-03-31 LAB — CBC
Hematocrit: 38.8 % (ref 34.0–46.6)
Hemoglobin: 13 g/dL (ref 11.1–15.9)
MCH: 29 pg (ref 26.6–33.0)
MCHC: 33.5 g/dL (ref 31.5–35.7)
MCV: 86 fL (ref 79–97)
PLATELETS: 370 10*3/uL (ref 150–379)
RBC: 4.49 x10E6/uL (ref 3.77–5.28)
RDW: 13.3 % (ref 12.3–15.4)
WBC: 10.3 10*3/uL (ref 3.4–10.8)

## 2017-03-31 LAB — LIPID PANEL
CHOLESTEROL TOTAL: 144 mg/dL (ref 100–199)
Chol/HDL Ratio: 2.7 ratio (ref 0.0–4.4)
HDL: 53 mg/dL (ref >39)
LDL Calculated: 65 mg/dL (ref 0–99)
Triglycerides: 129 mg/dL (ref 0–149)
VLDL CHOLESTEROL CAL: 26 mg/dL (ref 5–40)

## 2017-04-04 ENCOUNTER — Encounter: Payer: BLUE CROSS/BLUE SHIELD | Admitting: Family Medicine

## 2017-04-04 ENCOUNTER — Telehealth: Payer: Self-pay | Admitting: Family Medicine

## 2017-04-04 ENCOUNTER — Encounter: Payer: BLUE CROSS/BLUE SHIELD | Admitting: Physical Therapy

## 2017-04-04 NOTE — Telephone Encounter (Signed)
Spoke with patient and her Mother (active DPR EC) regarding an appointment for today, we do not have any availability with any provider.  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  Explained that PCP would want to complete CPE and any visit for personal concerns.  If pt has an acute need we can [place].  Pt will call us back to reschedule.

## 2017-04-06 ENCOUNTER — Encounter: Payer: BLUE CROSS/BLUE SHIELD | Admitting: Family Medicine

## 2017-04-06 ENCOUNTER — Ambulatory Visit: Payer: BLUE CROSS/BLUE SHIELD | Admitting: Family Medicine

## 2017-04-21 ENCOUNTER — Other Ambulatory Visit: Payer: Self-pay | Admitting: Family Medicine

## 2017-05-30 ENCOUNTER — Ambulatory Visit (INDEPENDENT_AMBULATORY_CARE_PROVIDER_SITE_OTHER): Payer: BLUE CROSS/BLUE SHIELD | Admitting: Family Medicine

## 2017-05-30 ENCOUNTER — Encounter: Payer: Self-pay | Admitting: Family Medicine

## 2017-05-30 VITALS — BP 102/78 | HR 114 | Ht 66.5 in | Wt 149.0 lb

## 2017-05-30 DIAGNOSIS — Z01419 Encounter for gynecological examination (general) (routine) without abnormal findings: Secondary | ICD-10-CM | POA: Diagnosis not present

## 2017-05-30 DIAGNOSIS — Z309 Encounter for contraceptive management, unspecified: Secondary | ICD-10-CM

## 2017-05-30 DIAGNOSIS — Z124 Encounter for screening for malignant neoplasm of cervix: Secondary | ICD-10-CM | POA: Diagnosis not present

## 2017-05-30 DIAGNOSIS — Z0000 Encounter for general adult medical examination without abnormal findings: Secondary | ICD-10-CM

## 2017-05-30 DIAGNOSIS — F909 Attention-deficit hyperactivity disorder, unspecified type: Secondary | ICD-10-CM | POA: Diagnosis not present

## 2017-05-30 MED ORDER — DROSPIRENONE-ETHINYL ESTRADIOL 3-0.02 MG PO TABS: 1.0000 | ORAL_TABLET | Freq: Every day | ORAL | 11 refills | Status: DC

## 2017-05-30 NOTE — Progress Notes (Signed)
Subjective:   Patient ID: Mercedes Kramer, female    DOB: 05/14/1991, 26 y.o.   MRN: [id number]  Mercedes Kramer is a pleasant 26 y.o. year old female who presents to clinic today with Annual Exam and Contraception (PT WOULD LIKE A DIFFERENT BIRTH CONTROL. )  on 05/30/2017  HPI:  Last pap smear 01/02/14- done by me.  Contraceptive management-On Junel. Currently sexually active with one partner.  No mid cycle spotting but she feels it is causing weight gain.  She would like to try a different OCP. Denies any dysuria or abnormal vaginal discharge.  ADHD- per pt, formal evaluation and diagnosis in 4th grade. On Adderall 30 twice daily.  Feels she is concentrating well at work.  Lab Results  Component Value Date   CHOL 144 03/30/2017   HDL 53 03/30/2017   LDLCALC 65 03/30/2017   TRIG 129 03/30/2017   CHOLHDL 2.7 03/30/2017   Lab Results  Component Value Date   NA 141 03/30/2017   K 4.5 03/30/2017   CL 102 03/30/2017   CO2 26 03/30/2017   Lab Results  Component Value Date   WBC 10.3 03/30/2017   HGB 13.0 03/30/2017   HCT 38.8 03/30/2017   MCV 86 03/30/2017   PLT 370 03/30/2017   Lab Results  Component Value Date   TSH 1.10 01/02/2014    Current Outpatient Prescriptions on File Prior to Visit  Medication Sig Dispense Refill  . amphetamine-dextroamphetamine (ADDERALL) 30 MG tablet Take 1 tablet by mouth 2 (two) times daily. 60 tablet 0   No current facility-administered medications on file prior to visit.     No Known Allergies  Past Medical History:  Diagnosis Date  . Acne   . ADHD (attention deficit hyperactivity disorder)   . Tinea versicolor     No past surgical history on file.  No family history on file.  Social History   Social History  . Marital status: Single    Spouse name: N/A  . Number of children: N/A  . Years of education: N/A   Occupational History  . Not on file.   Social History Main Topics  . Smoking status: Never Smoker  . Smokeless  tobacco: Never Used  . Alcohol use No  . Drug use: No  . Sexual activity: Yes    Partners: Male    Birth control/ protection: OCP   Other Topics Concern  . Not on file   Social History Narrative   CNA   The PMH, PSH, Social History, Family History, Medications, and allergies have been reviewed in [facility], and have been updated if relevant.   Review of Systems  HENT: Negative.   Eyes: Negative.   Respiratory: Negative.   Cardiovascular: Negative.   Gastrointestinal: Negative.   Endocrine: Negative.   Genitourinary: Negative.   Musculoskeletal: Negative.   Allergic/Immunologic: Negative.   Neurological: Negative.   Hematological: Negative.   Psychiatric/Behavioral: Negative.   All other systems reviewed and are negative.      Objective:    BP 102/78   Pulse (!) 114   Ht 5' 6.5" (1.689 m)   Wt 149 lb (67.6 kg)   LMP 04/27/2017   SpO2 99%   BMI 23.69 kg/m   Wt Readings from Last 3 Encounters:  05/30/17 149 lb (67.6 kg)  03/30/17 149 lb (67.6 kg)  10/23/15 153 lb (69.4 kg)      Physical Exam   General:  Well-developed,well-nourished,in no acute distress; alert,appropriate and cooperative throughout examination Head:  normocephalic and atraumatic.   Eyes:  vision grossly intact, PERRL Ears:  R ear normal and L ear normal externally, TMs clear bilaterally Nose:  no external deformity.   Mouth:  good dentition.   Neck:  No deformities, masses, or tenderness noted. Breasts:  No mass, nodules, thickening, tenderness, bulging, retraction, inflamation, nipple discharge or skin changes noted.   Lungs:  Normal respiratory effort, chest expands symmetrically. Lungs are clear to auscultation, no crackles or wheezes. Heart:  Normal rate and regular rhythm. S1 and S2 normal without gallop, murmur, click, rub or other extra sounds. Abdomen:  Bowel sounds positive,abdomen soft and non-tender without masses, organomegaly or hernias noted. Rectal:  no external abnormalities.     Genitalia:  Pelvic Exam:        External: normal female genitalia without lesions or masses        Vagina: normal without lesions or masses        Cervix: normal without lesions or masses        Adnexa: normal bimanual exam without masses or fullness        Uterus: normal by palpation        Pap smear: performed Msk:  No deformity or scoliosis noted of thoracic or lumbar spine.   Extremities:  No clubbing, cyanosis, edema, or deformity noted with normal full range of motion of all joints.   Neurologic:  alert & oriented X3 and gait normal.   Skin:  Intact without suspicious lesions or rashes Cervical Nodes:  No lymphadenopathy noted Axillary Nodes:  No palpable lymphadenopathy Psych:  Cognition and judgment appear intact. Alert and cooperative with normal attention span and concentration. No apparent delusions, illusions, hallucinations        Assessment & Plan:   Routine general medical examination at a health care facility  Encounter for gynecological examination without abnormal finding No Follow-up on file.

## 2017-05-30 NOTE — Addendum Note (Signed)
Addended by: Baldomero LamyHAVERS, NATASHA C on: 05/30/2017 10:22 AM   Modules accepted: Orders

## 2017-05-30 NOTE — Patient Instructions (Signed)
Great to see you. We will call you with your results from today. Ok to start Yaz right away.  Pleas keep me updated with how Yaz works for you.

## 2017-05-30 NOTE — Assessment & Plan Note (Signed)
Pap smear and STD screening done today.

## 2017-05-30 NOTE — Assessment & Plan Note (Signed)
Reviewed preventive care protocols, scheduled due services, and updated immunizations Discussed nutrition, exercise, diet, and healthy lifestyle.  

## 2017-05-30 NOTE — Assessment & Plan Note (Signed)
Well controlled on current dose of Adderall.

## 2017-05-30 NOTE — Assessment & Plan Note (Signed)
Education given regarding options for contraception, including injectable contraception, IUD placement, oral contraceptives. She would like to try another OCP. eRx sent for Yaz. She will keep me updated.

## 2017-06-01 LAB — PAP LB, CT-NG TV HPV-HR
CHLAMYDIA, NUC. ACID AMP: NEGATIVE
GONOCOCCUS, NUC. ACID AMP: NEGATIVE
HPV, high-risk: NEGATIVE
PAP SMEAR COMMENT: 0
Trich vag by NAA: NEGATIVE

## 2017-07-25 ENCOUNTER — Other Ambulatory Visit: Payer: Self-pay

## 2017-07-25 NOTE — Telephone Encounter (Signed)
Okay to print out and place on my desk for signature. 

## 2017-07-25 NOTE — Telephone Encounter (Signed)
Pt left v/m requesting rx for Adderall. Call when ready for pick up. Last printed #60 on 03/30/17. Last seen annual on 05/30/17.

## 2017-07-26 MED ORDER — AMPHETAMINE-DEXTROAMPHETAMINE 30 MG PO TABS: 30.0000 mg | ORAL_TABLET | Freq: Two times a day (BID) | ORAL | 0 refills | Status: DC

## 2017-10-16 ENCOUNTER — Ambulatory Visit (INDEPENDENT_AMBULATORY_CARE_PROVIDER_SITE_OTHER): Payer: BLUE CROSS/BLUE SHIELD | Admitting: Family Medicine

## 2017-10-16 ENCOUNTER — Ambulatory Visit: Payer: Self-pay | Admitting: *Deleted

## 2017-10-16 ENCOUNTER — Encounter: Payer: Self-pay | Admitting: Family Medicine

## 2017-10-16 VITALS — BP 98/60 | HR 67 | Temp 98.1°F | Ht 66.5 in | Wt 154.8 lb

## 2017-10-16 DIAGNOSIS — R000 Tachycardia, unspecified: Secondary | ICD-10-CM | POA: Diagnosis not present

## 2017-10-16 DIAGNOSIS — F418 Other specified anxiety disorders: Secondary | ICD-10-CM | POA: Diagnosis not present

## 2017-10-16 DIAGNOSIS — R002 Palpitations: Secondary | ICD-10-CM

## 2017-10-16 MED ORDER — ESCITALOPRAM OXALATE 10 MG PO TABS: 10.0000 mg | ORAL_TABLET | Freq: Every day | ORAL | 3 refills | Status: DC

## 2017-10-16 NOTE — Assessment & Plan Note (Signed)
Nl EKG and exam  Suspect related to anxiety  Enc her to stop all caffeine Self care enc including good sleep and eating habits Will see if this resolved with tx of anxiety  Update if not-consider cardiol eval/monitor

## 2017-10-16 NOTE — Assessment & Plan Note (Signed)
This has worsened lately with multiple significant stressors Developing more physical symptoms like palpitations Reviewed stressors/ coping techniques/symptoms/ support sources/ tx options and side effects in detail today Has done counseling  Disc medication-choose lexapro 10 mg (low dose) Discussed expectations of SSRI medication including time to effectiveness and mechanism of action, also poss of side effects (early and late)- including mental fuzziness, weight or appetite change, nausea and poss of worse dep or anxiety (even suicidal thoughts)  Pt voiced understanding and will stop med and update if this occurs   She will f/u with her pcp at her new office in 2-4 weeks  >25 minutes spent in face to face time with patient, >50% spent in counselling or coordination of care including review of tx options/ stress coping mechanisms and disc of meditation and exercise as tools
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
## 2017-10-16 NOTE — Patient Instructions (Addendum)
Eliminate caffeine completely   Keep exercising  Get outdoors whenever you can  Start lexapro 10 mg once daily with food around the same time  If any intolerable side effects or if you feel worse-stop it and let us   Get back to counseling if you think you need it   Follow up with Dr Dayton MartesAron in 2-4 weeks

## 2017-10-16 NOTE — Telephone Encounter (Signed)
Pt's mother, Fernanda DrumStephanie Gilkeson, called  Stating her daughter is having an increased heart rate and some associatd shorntess of breath; she also states that her daughter has taken her prn that was previously prescribed and has not taken her ADD medication since last week; pt's mom states that the pat may have some anxiety because she is going through a divorce; pt's mom states that the pt feels like this is anxiety related and would [facility]; pt's mom offered and accepted appointment today [facility]; her mother verbalizes understanding and will take the pt tthis appointment; will [facility] to notify them of this upcoming appointment    Reason for Disposition . Difficulty breathing  Answer Assessment - Initial Assessment Questions 1. DESCRIPTION: "Please describe your heart rate or heart beat that you are having" (e.g., fast/slow, regular/irregular, skipped or extra beats, "palpitations")     fast 2. ONSET: "When did it start?" (Minutes, hours or days)      Friday  3. DURATION: "How long does it last" (e.g., seconds, minutes, hours)     hours 4. PATTERN "Does it come and go, or has it been constant since it started?"  "Does it get worse with exertion?"   "Are you feeling it now?"     constant 5. TAP: "Using your hand, can you tap out what you are feeling on a chair or table in front of you, so that I can hear?" (Note: not all patients can do this)       Unable to access    6. HEART RATE: "Can you tell me your heart rate?" "How many beats in 15 seconds?"  (Note: not all patients can do this)       Unable to access 7. RECURRENT SYMPTOM: "Have you ever had this before?" If so, ask: "When was the last time?" and "What happened that time?"      unknown 8. CAUSE: "What do you think is causing the palpitations?"     Anxiety, has not taken ADD meds, anxiety 9. CARDIAC HISTORY: "Do you have any history of heart  disease?" (e.g., heart attack, angina, bypass surgery, angioplasty, arrhythmia)      Yes palpitations 10. OTHER SYMPTOMS: "Do you have any other symptoms?" (e.g., dizziness, chest pain, sweating, difficulty breathing)       oscassional shortness of breath 11. PREGNANCY: "Is there any chance you are pregnant?" "When was your last menstrual period?"       No Yaz  Protocols used: HEART RATE AND HEARTBEAT QUESTIONS-A-AH

## 2017-11-09 ENCOUNTER — Other Ambulatory Visit: Payer: Self-pay | Admitting: Family Medicine

## 2017-11-09 MED ORDER — AMPHETAMINE-DEXTROAMPHETAMINE 30 MG PO TABS: 30.0000 mg | ORAL_TABLET | Freq: Two times a day (BID) | ORAL | 0 refills | Status: DC

## 2017-11-09 NOTE — Telephone Encounter (Signed)
Routed to provider

## 2017-11-09 NOTE — Telephone Encounter (Signed)
Rx signed and ready at the front/I tried to call but VM is full/I sent pt a MyChart message letting her know that it is ready here at [facility]/thx dmf

## 2017-11-09 NOTE — Telephone Encounter (Signed)
Okay to refill? 

## 2017-11-09 NOTE — Telephone Encounter (Signed)
Copied from CRM [id number]. Topic: Quick Communication - Rx Refill/Question >> Nov 09, 2017 11:54 AM Landry MellowFoltz, Melissa J wrote: Medication: amphetamine-dextroamphetamine (ADDERALL) 30 MG tablet   Has the patient contacted their pharmacy? Yes.     (Agent: If no, request that the patient contact the pharmacy for the refill.)   Preferred Pharmacy (with phone number or street name): pick up , knows she will pick up at [facility]    Agent: Please be advised that RX refills may take up to 3 business days. We ask that you follow-up with your pharmacy.

## 2017-11-09 NOTE — Telephone Encounter (Signed)
Yes okay to refill.  Rx printed.

## 2017-11-21 ENCOUNTER — Encounter: Payer: Self-pay | Admitting: Family Medicine

## 2017-11-21 ENCOUNTER — Other Ambulatory Visit: Payer: Self-pay | Admitting: Family Medicine

## 2017-11-23 NOTE — Telephone Encounter (Signed)
Rx has been ready for a long time to pick-up at the front desk/I have sent her 2 MyChart messages now asking her to schedule an appointment and this morning asked if she needed someone [facility]/thx dmf

## 2018-01-16 DIAGNOSIS — M79671 Pain in right foot: Secondary | ICD-10-CM | POA: Diagnosis not present

## 2018-01-16 DIAGNOSIS — S93611A Sprain of tarsal ligament of right foot, initial encounter: Secondary | ICD-10-CM | POA: Diagnosis not present

## 2018-01-16 DIAGNOSIS — S93601A Unspecified sprain of right foot, initial encounter: Secondary | ICD-10-CM | POA: Diagnosis not present

## 2018-02-28 DIAGNOSIS — J069 Acute upper respiratory infection, unspecified: Secondary | ICD-10-CM | POA: Diagnosis not present

## 2018-02-28 DIAGNOSIS — J029 Acute pharyngitis, unspecified: Secondary | ICD-10-CM | POA: Diagnosis not present

## 2018-03-29 ENCOUNTER — Other Ambulatory Visit: Payer: Self-pay | Admitting: Family Medicine

## 2018-03-29 NOTE — Telephone Encounter (Signed)
Copied from CRM [id number]. Topic: Quick Communication - See Telephone Encounter >> Mar 29, 2018  2:23 PM Waymon Amato wrote: Pt is needing a refill on adderall 30 mg  Best number [phone]   [facility] [address]

## 2018-03-30 NOTE — Telephone Encounter (Signed)
Rx refill request: Adderall 30 mg      Last filled: 11/09/17 # 60  LOV: 05/30/17  PCP: Dayton Martes  Pharmacy: verified

## 2018-04-03 ENCOUNTER — Ambulatory Visit (INDEPENDENT_AMBULATORY_CARE_PROVIDER_SITE_OTHER): Payer: BLUE CROSS/BLUE SHIELD | Admitting: Family Medicine

## 2018-04-03 ENCOUNTER — Encounter: Payer: Self-pay | Admitting: Family Medicine

## 2018-04-03 VITALS — BP 118/60 | HR 105 | Temp 98.4°F | Ht 65.5 in | Wt 154.0 lb

## 2018-04-03 DIAGNOSIS — F418 Other specified anxiety disorders: Secondary | ICD-10-CM

## 2018-04-03 DIAGNOSIS — Z309 Encounter for contraceptive management, unspecified: Secondary | ICD-10-CM

## 2018-04-03 DIAGNOSIS — F909 Attention-deficit hyperactivity disorder, unspecified type: Secondary | ICD-10-CM | POA: Diagnosis not present

## 2018-04-03 MED ORDER — AMPHETAMINE-DEXTROAMPHETAMINE 30 MG PO TABS: 30.0000 mg | ORAL_TABLET | Freq: Two times a day (BID) | ORAL | 0 refills | Status: DC

## 2018-04-03 MED ORDER — DROSPIRENONE-ETHINYL ESTRADIOL 3-0.02 MG PO TABS: 1.0000 | ORAL_TABLET | Freq: Every day | ORAL | 11 refills | Status: DC

## 2018-04-03 MED ORDER — ESCITALOPRAM OXALATE 10 MG PO TABS: 10.0000 mg | ORAL_TABLET | Freq: Every day | ORAL | 3 refills | Status: DC

## 2018-04-03 NOTE — Assessment & Plan Note (Signed)
Well controlled on current dose of Adderall. Rx printed and given to pt.

## 2018-04-03 NOTE — Assessment & Plan Note (Signed)
Continue current OCPs.

## 2018-04-03 NOTE — Assessment & Plan Note (Signed)
Depression and anxiety screens done today. Symptoms controlled. No changes made to rxs.

## 2018-04-03 NOTE — Progress Notes (Signed)
Subjective:   Patient ID: Mercedes Kramer, female    DOB: December 19, 1990, 27 y.o.   MRN: [id number]  Mercedes Kramer is a pleasant 27 y.o. year old female who presents to clinic today with Follow-up  on 04/03/2018  HPI:  ADD- feels current dose of Adderall is working well. Takes it only as needed but she is back in nursing school so she is taking it twice daily.  If she takes second dose too late in the afternoon, she does have trouble sleeping.  She feels lexapro is working really well for anxiety and is pleased with her current OCP.   No current outpatient medications on file prior to visit.   No current facility-administered medications on file prior to visit.     No Known Allergies  Past Medical History:  Diagnosis Date  . Acne   . ADHD (attention deficit hyperactivity disorder)   . Tinea versicolor     No past surgical history on file.  No family history on file.  Social History   Socioeconomic History  . Marital status: Single    Spouse name: Not on file  . Number of children: Not on file  . Years of education: Not on file  . Highest education level: Not on file  Occupational History  . Not on file  Social Needs  . Financial resource strain: Not on file  . Food insecurity:    Worry: Not on file    Inability: Not on file  . Transportation needs:    Medical: Not on file    Non-medical: Not on file  Tobacco Use  . Smoking status: Never Smoker  . Smokeless tobacco: Never Used  Substance and Sexual Activity  . Alcohol use: No  . Drug use: No  . Sexual activity: Yes    Partners: Male    Birth control/protection: OCP  Lifestyle  . Physical activity:    Days per week: Not on file    Minutes per session: Not on file  . Stress: Not on file  Relationships  . Social connections:    Talks on phone: Not on file    Gets together: Not on file    Attends religious service: Not on file    Active member of club or organization: Not on file    Attends meetings of clubs  or organizations: Not on file    Relationship status: Not on file  . Intimate partner violence:    Fear of current or ex partner: Not on file    Emotionally abused: Not on file    Physically abused: Not on file    Forced sexual activity: Not on file  Other Topics Concern  . Not on file  Social History Narrative   CNA   The PMH, PSH, Social History, Family History, Medications, and allergies have been reviewed in [facility], and have been updated if relevant.   Review of Systems  Constitutional: Negative.   Genitourinary: Negative.   Psychiatric/Behavioral: Negative.   All other systems reviewed and are negative.      Objective:    BP 118/60 (BP Location: Left Arm, Patient Position: Sitting, Cuff Size: Normal)   Pulse (!) 105   Temp 98.4 F (36.9 C) (Oral)   Ht 5' 5.5" (1.664 m)   Wt 154 lb (69.9 kg)   LMP 02/28/2018   SpO2 97%   BMI 25.24 kg/m    Physical Exam    General:  Well-developed,well-nourished,in no acute distress; alert,appropriate and cooperative throughout examination Head:  normocephalic and atraumatic.   Eyes:  vision grossly intact, PERRL Ears:  R ear normal and L ear normal externally, TMs clear bilaterally Nose:  no external deformity.   Mouth:  good dentition.   Neck:  No deformities, masses, or tenderness noted. Lungs:  Normal respiratory effort, chest expands symmetrically. Lungs are clear to auscultation, no crackles or wheezes. Heart:  Normal rate and regular rhythm. S1 and S2 normal without gallop, murmur, click, rub or other extra sounds. Msk:  No deformity or scoliosis noted of thoracic or lumbar spine.   Extremities:  No clubbing, cyanosis, edema, or deformity noted with normal full range of motion of all joints.   Neurologic:  alert & oriented X3 and gait normal.   Skin:  Intact without suspicious lesions or rashes Psych:  Cognition and judgment appear intact. Alert and cooperative with normal attention span and concentration. No apparent  delusions, illusions, hallucinations     Assessment & Plan:   Attention deficit hyperactivity disorder (ADHD), unspecified ADHD type  Encounter for contraceptive management, unspecified type  Situational anxiety No follow-ups on file.

## 2018-04-03 NOTE — Patient Instructions (Signed)
Great to see you. Have a great summer!   

## 2018-10-11 ENCOUNTER — Other Ambulatory Visit: Payer: Self-pay | Admitting: Family Medicine

## 2018-10-11 MED ORDER — AMPHETAMINE-DEXTROAMPHETAMINE 30 MG PO TABS: 30.0000 mg | ORAL_TABLET | Freq: Two times a day (BID) | ORAL | 0 refills | Status: DC

## 2018-10-11 NOTE — Telephone Encounter (Signed)
TA-LOV: 6.4.19/NOV: not scheduled yet and will update CSC & UDS at [facility]/PMP ok no red flags/thx dmf

## 2018-10-11 NOTE — Telephone Encounter (Signed)
Copied from CRM [id number]. Topic: Quick Communication - Rx Refill/Question >> Oct 11, 2018 11:44 AM Jolayne Hainesaylor, Brittany L wrote: Medication: amphetamine-dextroamphetamine (ADDERALL) 30 MG tablet [[id number]][[id number]]   Has the patient contacted their pharmacy? Yes told her to call office  (Agent: If no, request that the patient contact the pharmacy for the refill.) (Agent: If yes, when and what did the pharmacy advise?)  [facility] [address]   Agent: Please be advised that RX refills may take up to 3 business days. We ask that you follow-up with your pharmacy.

## 2019-01-24 ENCOUNTER — Encounter: Payer: Self-pay | Admitting: Family Medicine

## 2019-01-29 ENCOUNTER — Other Ambulatory Visit: Payer: Self-pay

## 2019-01-29 DIAGNOSIS — N926 Irregular menstruation, unspecified: Secondary | ICD-10-CM

## 2019-01-30 ENCOUNTER — Other Ambulatory Visit (INDEPENDENT_AMBULATORY_CARE_PROVIDER_SITE_OTHER): Payer: BLUE CROSS/BLUE SHIELD

## 2019-01-30 ENCOUNTER — Other Ambulatory Visit: Payer: Self-pay

## 2019-01-30 DIAGNOSIS — N926 Irregular menstruation, unspecified: Secondary | ICD-10-CM

## 2019-01-30 NOTE — Addendum Note (Signed)
Addended by: Warden Fillers on: 01/30/2019 10:58 AM   Modules accepted: Orders

## 2019-01-31 LAB — HCG, QUANTITATIVE, PREGNANCY: HCG, Total, QN: 144429 m[IU]/mL

## 2019-01-31 LAB — PREGNANCY, URINE: Preg Test, Ur: POSITIVE — AB

## 2019-02-01 ENCOUNTER — Encounter: Payer: Self-pay | Admitting: Family Medicine

## 2019-02-01 NOTE — Telephone Encounter (Signed)
Please advise. Thanks , [employer]

## 2019-02-08 DIAGNOSIS — O359XX0 Maternal care for (suspected) fetal abnormality and damage, unspecified, not applicable or unspecified: Secondary | ICD-10-CM | POA: Diagnosis not present

## 2019-02-08 DIAGNOSIS — Z369 Encounter for antenatal screening, unspecified: Secondary | ICD-10-CM | POA: Diagnosis not present

## 2019-02-08 DIAGNOSIS — Z3401 Encounter for supervision of normal first pregnancy, first trimester: Secondary | ICD-10-CM | POA: Diagnosis not present

## 2019-02-08 DIAGNOSIS — Z1389 Encounter for screening for other disorder: Secondary | ICD-10-CM | POA: Diagnosis not present

## 2019-02-27 DIAGNOSIS — Z3401 Encounter for supervision of normal first pregnancy, first trimester: Secondary | ICD-10-CM | POA: Diagnosis not present

## 2019-04-02 DIAGNOSIS — Z3402 Encounter for supervision of normal first pregnancy, second trimester: Secondary | ICD-10-CM | POA: Diagnosis not present

## 2019-04-02 DIAGNOSIS — Z3A16 16 weeks gestation of pregnancy: Secondary | ICD-10-CM | POA: Diagnosis not present

## 2019-04-18 DIAGNOSIS — Z363 Encounter for antenatal screening for malformations: Secondary | ICD-10-CM | POA: Diagnosis not present

## 2019-04-18 DIAGNOSIS — O359XX0 Maternal care for (suspected) fetal abnormality and damage, unspecified, not applicable or unspecified: Secondary | ICD-10-CM | POA: Diagnosis not present

## 2019-04-18 DIAGNOSIS — Z1389 Encounter for screening for other disorder: Secondary | ICD-10-CM | POA: Diagnosis not present

## 2019-04-18 DIAGNOSIS — Z3A18 18 weeks gestation of pregnancy: Secondary | ICD-10-CM | POA: Diagnosis not present

## 2019-04-23 DIAGNOSIS — R0602 Shortness of breath: Secondary | ICD-10-CM | POA: Diagnosis not present

## 2019-04-23 DIAGNOSIS — Z3A19 19 weeks gestation of pregnancy: Secondary | ICD-10-CM | POA: Diagnosis not present

## 2019-04-23 DIAGNOSIS — O99512 Diseases of the respiratory system complicating pregnancy, second trimester: Secondary | ICD-10-CM | POA: Diagnosis not present

## 2019-04-23 DIAGNOSIS — O26812 Pregnancy related exhaustion and fatigue, second trimester: Secondary | ICD-10-CM | POA: Diagnosis not present

## 2019-06-25 DIAGNOSIS — Z23 Encounter for immunization: Secondary | ICD-10-CM | POA: Diagnosis not present

## 2019-06-25 DIAGNOSIS — Z3402 Encounter for supervision of normal first pregnancy, second trimester: Secondary | ICD-10-CM | POA: Diagnosis not present

## 2019-07-02 DIAGNOSIS — Z20828 Contact with and (suspected) exposure to other viral communicable diseases: Secondary | ICD-10-CM | POA: Diagnosis not present

## 2019-07-02 DIAGNOSIS — J029 Acute pharyngitis, unspecified: Secondary | ICD-10-CM | POA: Diagnosis not present

## 2019-08-23 DIAGNOSIS — Z3403 Encounter for supervision of normal first pregnancy, third trimester: Secondary | ICD-10-CM | POA: Diagnosis not present

## 2019-08-26 DIAGNOSIS — B951 Streptococcus, group B, as the cause of diseases classified elsewhere: Secondary | ICD-10-CM | POA: Diagnosis present

## 2019-09-11 DIAGNOSIS — Z3A39 39 weeks gestation of pregnancy: Secondary | ICD-10-CM | POA: Diagnosis not present

## 2019-09-11 DIAGNOSIS — O99513 Diseases of the respiratory system complicating pregnancy, third trimester: Secondary | ICD-10-CM | POA: Diagnosis not present

## 2019-09-11 DIAGNOSIS — O9852 Other viral diseases complicating childbirth: Secondary | ICD-10-CM | POA: Diagnosis not present

## 2019-09-11 DIAGNOSIS — U071 COVID-19: Secondary | ICD-10-CM | POA: Diagnosis not present

## 2019-09-11 DIAGNOSIS — O99824 Streptococcus B carrier state complicating childbirth: Secondary | ICD-10-CM | POA: Diagnosis not present

## 2019-09-11 DIAGNOSIS — Z79899 Other long term (current) drug therapy: Secondary | ICD-10-CM | POA: Diagnosis not present

## 2019-09-11 DIAGNOSIS — O9081 Anemia of the puerperium: Secondary | ICD-10-CM | POA: Diagnosis not present

## 2019-09-30 NOTE — Progress Notes (Unsigned)
Subjective:    Patient ID: Mercedes Kramer, female    DOB: 11-Apr-1991, 28 y.o.   MRN: [id number]  Chief Complaint  Patient presents with  . ADHD  . Anxiety  . Contraception    HPI Patient is in today for a follow-up.   She has not been seen for ADHD, Anxiety, or Contraception Management since 6.4.2019.    Per [place] PMP pt has not had Adderall 30mg  bid filled since 12.12.2019. UDS & CSC (created and sent to pt via MyChart) will need to be updated today as well as PHQ-9 & GAD-7.  Depression screen [facility] 2/9 04/03/2018 05/30/2017  Decreased Interest 0 0  Down, Depressed, Hopeless 0 0  PHQ - 2 Score 0 0  Altered sleeping 0 -  Tired, decreased energy 0 -  Change in appetite 0 -  Feeling bad or failure about yourself  0 -  Trouble concentrating 0 -  Moving slowly or fidgety/restless 0 -  Suicidal thoughts 0 -  PHQ-9 Score 0 -  Difficult doing work/chores Not difficult at all -   GAD 7 : Generalized Anxiety Score 04/03/2018  Nervous, Anxious, on Edge 1  Control/stop worrying 1  Worry too much - different things 1  Trouble relaxing 1  Restless 0  Easily annoyed or irritable 0  Afraid - awful might happen 0  Total GAD 7 Score 4  Anxiety Difficulty Somewhat difficult       Past Medical History:  Diagnosis Date  . Acne   . ADHD (attention deficit hyperactivity disorder)   . Tinea versicolor     No past surgical history on file.  No family history on file.  Social History   Socioeconomic History  . Marital status: Single    Spouse name: Not on file  . Number of children: Not on file  . Years of education: Not on file  . Highest education level: Not on file  Occupational History  . Not on file  Social Needs  . Financial resource strain: Not on file  . Food insecurity    Worry: Not on file    Inability: Not on file  . Transportation needs    Medical: Not on file    Non-medical: Not on file  Tobacco Use  . Smoking status: Never Smoker  . Smokeless tobacco: Never  Used  Substance and Sexual Activity  . Alcohol use: No  . Drug use: No  . Sexual activity: Yes    Partners: Male    Birth control/protection: OCP  Lifestyle  . Physical activity    Days per week: Not on file    Minutes per session: Not on file  . Stress: Not on file  Relationships  . Social 06/03/2018 on phone: Not on file    Gets together: Not on file    Attends religious service: Not on file    Active member of club or organization: Not on file    Attends meetings of clubs or organizations: Not on file    Relationship status: Not on file  . Intimate partner violence    Fear of current or ex partner: Not on file    Emotionally abused: Not on file    Physically abused: Not on file    Forced sexual activity: Not on file  Other Topics Concern  . Not on file  Social History Narrative   CNA    Outpatient Medications Prior to Visit  Medication Sig Dispense Refill  . amphetamine-dextroamphetamine (  ADDERALL) 30 MG tablet Take 1 tablet by mouth 2 (two) times daily. 60 tablet 0  . drospirenone-ethinyl estradiol (YAZ) 3-0.02 MG tablet Take 1 tablet by mouth daily. 1 Package 11  . escitalopram (LEXAPRO) 10 MG tablet Take 1 tablet (10 mg total) by mouth daily. 30 tablet 3   No facility-administered medications prior to visit.     No Known Allergies  ROS     Objective:    Physical Exam  There were no vitals taken for this visit. Wt Readings from Last 3 Encounters:  04/03/18 154 lb (69.9 kg)  10/16/17 154 lb 12 oz (70.2 kg)  05/30/17 149 lb (67.6 kg)     Lab Results  Component Value Date   WBC 10.3 03/30/2017   HGB 13.0 03/30/2017   HCT 38.8 03/30/2017   PLT 370 03/30/2017   GLUCOSE 91 03/30/2017   CHOL 144 03/30/2017   TRIG 129 03/30/2017   HDL 53 03/30/2017   LDLCALC 65 03/30/2017   ALT 16 03/30/2017   AST 11 03/30/2017   NA 141 03/30/2017   K 4.5 03/30/2017   CL 102 03/30/2017   CREATININE 0.81 03/30/2017   BUN 11 03/30/2017   CO2 26  03/30/2017   TSH 1.10 01/02/2014    Lab Results  Component Value Date   TSH 1.10 01/02/2014   Lab Results  Component Value Date   WBC 10.3 03/30/2017   HGB 13.0 03/30/2017   HCT 38.8 03/30/2017   MCV 86 03/30/2017   PLT 370 03/30/2017   Lab Results  Component Value Date   NA 141 03/30/2017   K 4.5 03/30/2017   CO2 26 03/30/2017   GLUCOSE 91 03/30/2017   BUN 11 03/30/2017   CREATININE 0.81 03/30/2017   BILITOT 0.4 03/30/2017   ALKPHOS 63 03/30/2017   AST 11 03/30/2017   ALT 16 03/30/2017   PROT 7.6 03/30/2017   ALBUMIN 4.9 03/30/2017   CALCIUM 9.8 03/30/2017   GFR 102.77 01/02/2014   Lab Results  Component Value Date   CHOL 144 03/30/2017   Lab Results  Component Value Date   HDL 53 03/30/2017   Lab Results  Component Value Date   LDLCALC 65 03/30/2017   Lab Results  Component Value Date   TRIG 129 03/30/2017   Lab Results  Component Value Date   CHOLHDL 2.7 03/30/2017   No results found for: HGBA1C    Assessment & Plan:   Problem List Items Addressed This Visit      Active Problems   ADHD (attention deficit hyperactivity disorder) - Primary    Discussed expectations with controlled substances, and she will fill out agreement today and UDS.        Relevant Orders   Pain Mgmt, Profile 8 w/Conf, U   Contraception management    Education given regarding options for contraception, including barrier methods, injectable contraception, IUD placement, oral contraceptives.       Situational anxiety    Currently taking lexapro 10 mg daily.         I am having Mercedes Kramer maintain her drospirenone-ethinyl estradiol, escitalopram, and amphetamine-dextroamphetamine.  No orders of the defined types were placed in this encounter.  This visit occurred during the SARS-CoV-2 public health emergency.  Safety protocols were in place, including screening questions prior to the visit, additional usage of staff PPE, and extensive cleaning of exam room  while observing appropriate contact time as indicated for disinfecting solutions.

## 2019-09-30 NOTE — Assessment & Plan Note (Signed)
Currently taking lexapro 10 mg daily.

## 2019-09-30 NOTE — Assessment & Plan Note (Signed)
Education given regarding options for contraception, including barrier methods, injectable contraception, IUD placement, oral contraceptives.

## 2019-09-30 NOTE — Assessment & Plan Note (Signed)
Discussed expectations with controlled substances, and she will fill out agreement today and UDS.

## 2019-10-01 ENCOUNTER — Ambulatory Visit: Payer: BLUE CROSS/BLUE SHIELD | Admitting: Family Medicine

## 2019-10-01 NOTE — Patient Instructions (Signed)
Health Maintenance Due  Topic Date Due  . PAP-Cervical Cytology Screening  01/02/2017  . TETANUS/TDAP  02/06/2019  . INFLUENZA VACCINE  06/01/2019    Depression screen [facility] 2/9 04/03/2018 05/30/2017  Decreased Interest 0 0  Down, Depressed, Hopeless 0 0  PHQ - 2 Score 0 0  Altered sleeping 0 -  Tired, decreased energy 0 -  Change in appetite 0 -  Feeling bad or failure about yourself  0 -  Trouble concentrating 0 -  Moving slowly or fidgety/restless 0 -  Suicidal thoughts 0 -  PHQ-9 Score 0 -  Difficult doing work/chores Not difficult at all -

## 2019-10-18 DIAGNOSIS — Z1331 Encounter for screening for depression: Secondary | ICD-10-CM | POA: Diagnosis not present

## 2019-11-06 ENCOUNTER — Telehealth: Payer: Self-pay | Admitting: Family Medicine

## 2019-11-06 MED ORDER — AMPHETAMINE-DEXTROAMPHETAMINE 30 MG PO TABS: 30.0000 mg | ORAL_TABLET | Freq: Two times a day (BID) | ORAL | 0 refills | Status: DC

## 2019-11-06 NOTE — Telephone Encounter (Signed)
Last OV 10/01/2019 Last fill 10/11/18  #60/0

## 2019-11-06 NOTE — Telephone Encounter (Signed)
Patient is calling and requested a medication refill for [facility] [address] in [place].

## 2019-11-06 NOTE — Addendum Note (Signed)
Addended by: Dianne Dun on: 11/06/2019 12:43 PM   Modules accepted: Orders

## 2019-11-11 DIAGNOSIS — Z0000 Encounter for general adult medical examination without abnormal findings: Secondary | ICD-10-CM | POA: Insufficient documentation

## 2019-11-11 NOTE — Assessment & Plan Note (Addendum)
History: Current symptoms include  Symptoms have been since that time. Patient denies  Previous treatment includes: medication. She complains of the following side effects from the treatment: none.  Depression screen [facility] 2/9 11/12/2019 04/03/2018 05/30/2017  Decreased Interest 0 0 0  Down, Depressed, Hopeless 0 0 0  PHQ - 2 Score 0 0 0  Altered sleeping - 0 -  Tired, decreased energy - 0 -  Change in appetite - 0 -  Feeling bad or failure about yourself  - 0 -  Trouble concentrating - 0 -  Moving slowly or fidgety/restless - 0 -  Suicidal thoughts - 0 -  PHQ-9 Score - 0 -  Difficult doing work/chores - Not difficult at all -    Assessment/Plan:      1. Medications: Not current taking Lexapro 10 mg daily because she is breast feeding. 2. Labs: see orders. 3. List of counselors provided. 4. Instructed patient to contact office or on-call physician promptly should condition worsen or any new symptoms appear. IF THE PATIENT HAS ANY SUICIDAL OR HOMICIDAL IDEATIONS, CALL THE OFFICE, DISCUSS WITH A SUPPORT MEMBER, OR GO TO THE ER IMMEDIATELY. Patient was agreeable with this plan.

## 2019-11-11 NOTE — Assessment & Plan Note (Addendum)
Discussed expectations with controlled substances, and she will fill out agreement today and UDS.  Due for UDS/CSC update.  She is not currently Adderral as she is breast feeding.

## 2019-11-11 NOTE — Progress Notes (Signed)
Virtual Visit via Video   Due to the COVID-19 pandemic, this visit was completed with telemedicine (audio/video) technology to reduce patient and provider exposure as well as to preserve personal protective equipment.   I connected with Mercedes Kramer by a video enabled telemedicine application and verified that I am speaking with the correct person using two identifiers. Location patient: Home Location provider: [facility], Office Persons participating in the virtual visit: Mercedes Kramer, Ruthe Mannan, MD   I discussed the limitations of evaluation and management by telemedicine and the availability of in person appointments. The patient expressed understanding and agreed to proceed.  Care Team   Patient Care Team: Dianne Dun, MD as PCP - General (Family Medicine)  Subjective:   HPI:   CPX and follow up of chronic medical conditions. She agrees to virtual visit.   She had her baby on 12.13.20 and is breast feeding. She received her flu shot and Tdap on 9.20.2020 while pregnant  . She no longer takes Lexapro or Adderall but is requesting a refill of OCPs. PHQ-2 negative. I have faxed a request for GYN updated records.    No signs of post partum depression.  Breast feeding is going well. Health Maintenance  Topic Date Due  . PAP-Cervical Cytology Screening  01/02/2017  . PAP SMEAR-Modifier  05/30/2020  . TETANUS/TDAP  07/20/2029  . INFLUENZA VACCINE  Completed  . HIV Screening  Completed   She has not been seen for ADHD, Anxiety, or Contraception Management since 6.4.2019.    Per [place] PMP pt has not had Adderall 30mg  bid filled since 12.12.2019. UDS & CSC (created and sent to pt via MyChart) will need to be updated today as well as PHQ-9 & GAD-7.  Depression screen [facility] 2/9 11/12/2019 04/03/2018 05/30/2017  Decreased Interest 0 0 0  Down, Depressed, Hopeless 0 0 0  PHQ - 2 Score 0 0 0  Altered sleeping - 0 -  Tired, decreased energy - 0 -  Change in appetite - 0 -    Feeling bad or failure about yourself  - 0 -  Trouble concentrating - 0 -  Moving slowly or fidgety/restless - 0 -  Suicidal thoughts - 0 -  PHQ-9 Score - 0 -  Difficult doing work/chores - Not difficult at all -   GAD 7 : Generalized Anxiety Score 04/03/2018  Nervous, Anxious, on Edge 1  Control/stop worrying 1  Worry too much - different things 1  Trouble relaxing 1  Restless 0  Easily annoyed or irritable 0  Afraid - awful might happen 0  Total GAD 7 Score 4  Anxiety Difficulty Somewhat difficult     Review of Systems  Constitutional: Negative.  Negative for chills and fever.  HENT: Negative.  Negative for congestion, hearing loss and sinus pain.   Eyes: Negative.  Negative for blurred vision and discharge.  Respiratory: Negative.  Negative for cough and shortness of breath.   Cardiovascular: Negative.  Negative for chest pain.  Gastrointestinal: Negative.  Negative for abdominal pain and heartburn.  Genitourinary: Negative.  Negative for dysuria.  Musculoskeletal: Negative.  Negative for falls and myalgias.  Skin: Negative.  Negative for rash.  Neurological: Negative.  Negative for dizziness and loss of consciousness.  Endo/Heme/Allergies: Negative.  Does not bruise/bleed easily.  Psychiatric/Behavioral: Negative.  Negative for memory loss.  All other systems reviewed and are negative.    Patient Active Problem List   Diagnosis Date Noted  . Well woman exam without gynecological  exam 11/11/2019  . Palpitations 10/16/2017  . Depression, recurrent (HCC) 11/01/2012  . Contraception management 11/03/2011  . ADHD (attention deficit hyperactivity disorder)   . Acne     Social History   Tobacco Use  . Smoking status: Never Smoker  . Smokeless tobacco: Never Used  Substance Use Topics  . Alcohol use: No    Current Outpatient Medications:  .  amphetamine-dextroamphetamine (ADDERALL) 30 MG tablet, Take 1 tablet by mouth 2 (two) times daily. (Patient not taking:  Reported on 11/12/2019), Disp: 60 tablet, Rfl: 0 .  drospirenone-ethinyl estradiol (YAZ) 3-0.02 MG tablet, Take 1 tablet by mouth daily. (Patient not taking: Reported on 11/12/2019), Disp: 1 Package, Rfl: 11 .  norethindrone (MICRONOR) 0.35 MG tablet, Take 1 tablet (0.35 mg total) by mouth daily., Disp: 1 Package, Rfl: 11  No Known Allergies  Objective:   VITALS: Per patient if applicable, see vitals. GENERAL: Alert, appears well and in no acute distress. HEENT: Atraumatic, conjunctiva clear, no obvious abnormalities on inspection of external nose and ears. NECK: Normal movements of the head and neck. CARDIOPULMONARY: No increased WOB. Speaking in clear sentences. I:E ratio WNL.  MS: Moves all visible extremities without noticeable abnormality. PSYCH: Pleasant and cooperative, well-groomed. Speech normal rate and rhythm. Affect is appropriate. Insight and judgement are appropriate. Attention is focused, linear, and appropriate.  NEURO: CN grossly intact. Oriented as arrived to appointment on time with no prompting. Moves both UE equally.  SKIN: No obvious lesions, wounds, erythema, or cyanosis noted on face or hands.  Depression screen [facility] 2/9 11/12/2019 04/03/2018 05/30/2017  Decreased Interest 0 0 0  Down, Depressed, Hopeless 0 0 0  PHQ - 2 Score 0 0 0  Altered sleeping - 0 -  Tired, decreased energy - 0 -  Change in appetite - 0 -  Feeling bad or failure about yourself  - 0 -  Trouble concentrating - 0 -  Moving slowly or fidgety/restless - 0 -  Suicidal thoughts - 0 -  PHQ-9 Score - 0 -  Difficult doing work/chores - Not difficult at all -     . COVID-19 Education: The signs and symptoms of COVID-19 were discussed with the patient and how to seek care for testing if needed. The importance of social distancing was discussed today. . Reviewed expectations re: course of current medical issues. . Discussed self-management of symptoms. . Outlined signs and symptoms indicating need for  more acute intervention. . Patient verbalized understanding and all questions were answered. [facility] Health Maintenance issues including appropriate healthy diet, exercise, and smoking avoidance were discussed with patient. . See orders for this visit as documented in the electronic medical record.  Ruthe Mannan, MD    Lab Results  Component Value Date   WBC 10.3 03/30/2017   HGB 13.0 03/30/2017   HCT 38.8 03/30/2017   PLT 370 03/30/2017   GLUCOSE 91 03/30/2017   CHOL 144 03/30/2017   TRIG 129 03/30/2017   HDL 53 03/30/2017   LDLCALC 65 03/30/2017   ALT 16 03/30/2017   AST 11 03/30/2017   NA 141 03/30/2017   K 4.5 03/30/2017   CL 102 03/30/2017   CREATININE 0.81 03/30/2017   BUN 11 03/30/2017   CO2 26 03/30/2017   TSH 1.10 01/02/2014    Lab Results  Component Value Date   TSH 1.10 01/02/2014   Lab Results  Component Value Date   WBC 10.3 03/30/2017   HGB 13.0 03/30/2017   HCT 38.8 03/30/2017  MCV 86 03/30/2017   PLT 370 03/30/2017   Lab Results  Component Value Date   NA 141 03/30/2017   K 4.5 03/30/2017   CO2 26 03/30/2017   GLUCOSE 91 03/30/2017   BUN 11 03/30/2017   CREATININE 0.81 03/30/2017   BILITOT 0.4 03/30/2017   ALKPHOS 63 03/30/2017   AST 11 03/30/2017   ALT 16 03/30/2017   PROT 7.6 03/30/2017   ALBUMIN 4.9 03/30/2017   CALCIUM 9.8 03/30/2017   GFR 102.77 01/02/2014   Lab Results  Component Value Date   CHOL 144 03/30/2017   Lab Results  Component Value Date   HDL 53 03/30/2017   Lab Results  Component Value Date   LDLCALC 65 03/30/2017   Lab Results  Component Value Date   TRIG 129 03/30/2017   Lab Results  Component Value Date   CHOLHDL 2.7 03/30/2017   No results found for: HGBA1C     Assessment & Plan:   Problem List Items Addressed This Visit      Active Problems   ADHD (attention deficit hyperactivity disorder)    Discussed expectations with controlled substances, and she will fill out agreement today and UDS.  Due  for UDS/CSC update.  She is not currently Adderral as she is breast feeding.      Contraception management    Contraceptive options were reviewed, including hormonal methods, both combination (pill, patch, vaginal ring) and progesterone-only (pill, Depo Provera, and Nexplanon), intrauterine devices (Mirena, Skyla, and Paraguard), barrier methods (condoms, diaphragm) as well as female/female sterilization. The mechanisms, risks, benefits and side effects of all methods were discussed. All questions have been answered to her satisfaction. I believe that micronor will be best option for her.   Pt is aware that hormone-based contraception has been prescribed today. Risks have been explained which are not limited to: increased clotting such as strokes, heart attacks, leg blood clots, and even death. However patient understand the benefits and alternatives and elects to proceed with being prescribed the hormone based medication. Pt is also aware that contraception management does not protect from sexually transmitted infections and that appropriate precautions must be taken in that regards.  We discussed the importance of using the morning after pill should she have any type of condom accident. We discussed how she would access that medication over-the-counter.  We discussed the importance of condoms for STI protection but that it is inadequate for birth control alone.          Relevant Orders   Comprehensive metabolic panel   Lipid panel   Comprehensive metabolic panel   Depression, recurrent ([place])    History: Current symptoms include  Symptoms have been since that time. Patient denies  Previous treatment includes: medication. She complains of the following side effects from the treatment: none.  Depression screen [facility] 2/9 11/12/2019 04/03/2018 05/30/2017  Decreased Interest 0 0 0  Down, Depressed, Hopeless 0 0 0  PHQ - 2 Score 0 0 0  Altered sleeping - 0 -  Tired, decreased energy - 0 -  Change  in appetite - 0 -  Feeling bad or failure about yourself  - 0 -  Trouble concentrating - 0 -  Moving slowly or fidgety/restless - 0 -  Suicidal thoughts - 0 -  PHQ-9 Score - 0 -  Difficult doing work/chores - Not difficult at all -    Assessment/Plan:      1. Medications: Not current taking Lexapro 10 mg daily because she is breast  feeding. 2. Labs: see orders. 3. List of counselors provided. 4. Instructed patient to contact office or on-call physician promptly should condition worsen or any new symptoms appear. IF THE PATIENT HAS ANY SUICIDAL OR HOMICIDAL IDEATIONS, CALL THE OFFICE, DISCUSS WITH A SUPPORT MEMBER, OR GO TO THE ER IMMEDIATELY. Patient was agreeable with this plan.       Well woman exam without gynecological exam - Primary    Reviewed preventive care protocols, scheduled due services, and updated immunizations Discussed nutrition, exercise, diet, and healthy lifestyle.        Other Visit Diagnoses    Long-term current use of stimulant       Relevant Orders   Pain Mgmt, Profile 8 w/Conf, U      I have discontinued Mercedes Rady's escitalopram. I am also having her start on norethindrone. Additionally, I am having her maintain her drospirenone-ethinyl estradiol and amphetamine-dextroamphetamine.  Meds ordered this encounter  Medications  . norethindrone (MICRONOR) 0.35 MG tablet    Sig: Take 1 tablet (0.35 mg total) by mouth daily.    Dispense:  1 Package    Refill:  11     Ruthe Mannan, MD

## 2019-11-11 NOTE — Assessment & Plan Note (Signed)
Reviewed preventive care protocols, scheduled due services, and updated immunizations Discussed nutrition, exercise, diet, and healthy lifestyle.  

## 2019-11-12 ENCOUNTER — Other Ambulatory Visit (INDEPENDENT_AMBULATORY_CARE_PROVIDER_SITE_OTHER): Payer: BC Managed Care – PPO

## 2019-11-12 ENCOUNTER — Other Ambulatory Visit: Payer: Self-pay

## 2019-11-12 ENCOUNTER — Telehealth (INDEPENDENT_AMBULATORY_CARE_PROVIDER_SITE_OTHER): Payer: BC Managed Care – PPO | Admitting: Family Medicine

## 2019-11-12 DIAGNOSIS — F909 Attention-deficit hyperactivity disorder, unspecified type: Secondary | ICD-10-CM

## 2019-11-12 DIAGNOSIS — Z79899 Other long term (current) drug therapy: Secondary | ICD-10-CM

## 2019-11-12 DIAGNOSIS — Z309 Encounter for contraceptive management, unspecified: Secondary | ICD-10-CM

## 2019-11-12 DIAGNOSIS — F339 Major depressive disorder, recurrent, unspecified: Secondary | ICD-10-CM

## 2019-11-12 DIAGNOSIS — Z0000 Encounter for general adult medical examination without abnormal findings: Secondary | ICD-10-CM

## 2019-11-12 LAB — LIPID PANEL
Cholesterol: 205 mg/dL — ABNORMAL HIGH (ref 0–200)
HDL: 44.9 mg/dL (ref >39.00)
NonHDL: 160.45
Total CHOL/HDL Ratio: 5
Triglycerides: 255 mg/dL — ABNORMAL HIGH (ref 0.0–149.0)
VLDL: 51 mg/dL — ABNORMAL HIGH (ref 0.0–40.0)

## 2019-11-12 LAB — COMPREHENSIVE METABOLIC PANEL
ALT: 78 U/L — ABNORMAL HIGH (ref 0–35)
AST: 31 U/L (ref 0–37)
Albumin: 4.9 g/dL (ref 3.5–5.2)
Alkaline Phosphatase: 98 U/L (ref 39–117)
BUN: 13 mg/dL (ref 6–23)
CO2: 28 mEq/L (ref 19–32)
Calcium: 10.1 mg/dL (ref 8.4–10.5)
Chloride: 102 mEq/L (ref 96–112)
Creatinine, Ser: 0.72 mg/dL (ref 0.40–1.20)
GFR: 96.6 mL/min (ref >60.00)
Glucose, Bld: 70 mg/dL (ref 70–99)
Potassium: 3.8 mEq/L (ref 3.5–5.1)
Sodium: 139 mEq/L (ref 135–145)
Total Bilirubin: 0.4 mg/dL (ref 0.2–1.2)
Total Protein: 8.1 g/dL (ref 6.0–8.3)

## 2019-11-12 LAB — LDL CHOLESTEROL, DIRECT: Direct LDL: 114 mg/dL

## 2019-11-12 MED ORDER — NORETHINDRONE 0.35 MG PO TABS: 1.0000 | ORAL_TABLET | Freq: Every day | ORAL | 11 refills | Status: DC

## 2019-11-12 NOTE — Assessment & Plan Note (Addendum)
Contraceptive options were reviewed, including hormonal methods, both combination (pill, patch, vaginal ring) and progesterone-only (pill, Depo Provera, and Nexplanon), intrauterine devices (Mirena, Skyla, and Paraguard), barrier methods (condoms, diaphragm) as well as female/female sterilization. The mechanisms, risks, benefits and side effects of all methods were discussed. All questions have been answered to her satisfaction. I believe that micronor will be best option for her.   Pt is aware that hormone-based contraception has been prescribed today. Risks have been explained which are not limited to: increased clotting such as strokes, heart attacks, leg blood clots, and even death. However patient understand the benefits and alternatives and elects to proceed with being prescribed the hormone based medication. Pt is also aware that contraception management does not protect from sexually transmitted infections and that appropriate precautions must be taken in that regards.  We discussed the importance of using the morning after pill should she have any type of condom accident. We discussed how she would access that medication over-the-counter.  We discussed the importance of condoms for STI protection but that it is inadequate for birth control alone.

## 2019-11-15 LAB — PAIN MGMT, PROFILE 8 W/CONF, U
6 Acetylmorphine: NEGATIVE ng/mL
Alcohol Metabolites: POSITIVE ng/mL — AB (ref <500)
Amphetamines: NEGATIVE ng/mL
Benzodiazepines: NEGATIVE ng/mL
Buprenorphine, Urine: NEGATIVE ng/mL
Cocaine Metabolite: NEGATIVE ng/mL
Creatinine: 86.6 mg/dL
Ethyl Glucuronide (ETG): 5693 ng/mL
Ethyl Sulfate (ETS): 332 ng/mL
MDMA: NEGATIVE ng/mL
Marijuana Metabolite: NEGATIVE ng/mL
Opiates: NEGATIVE ng/mL
Oxidant: NEGATIVE ug/mL
Oxycodone: NEGATIVE ng/mL
pH: 4.9 (ref 4.5–9.0)

## 2019-12-04 ENCOUNTER — Other Ambulatory Visit: Payer: Self-pay | Admitting: Family Medicine

## 2019-12-04 NOTE — Telephone Encounter (Signed)
Patient calling to get Rx refill on Adderall. Please call patient once Rx has been sent.

## 2019-12-05 ENCOUNTER — Other Ambulatory Visit: Payer: Self-pay | Admitting: Family Medicine

## 2019-12-05 MED ORDER — AMPHETAMINE-DEXTROAMPHETAMINE 30 MG PO TABS: 30.0000 mg | ORAL_TABLET | Freq: Two times a day (BID) | ORAL | 0 refills | Status: DC

## 2019-12-05 NOTE — Telephone Encounter (Signed)
TA-Plz see refill req/per [place] PMP pt is compliant without red flags/last filled 1.6.21/prepared and pended for your approval/thx dmf

## 2019-12-05 NOTE — Telephone Encounter (Signed)
TA-Pt req refill of Adderall/per [place] PMP pt is compliant without red flags/last filled 1.6.2021/prepared and pended for your approval/thx dmf

## 2019-12-05 NOTE — Telephone Encounter (Signed)
Patient is requesting a refill for [facility] [address] in [place]. CB is [phone]

## 2020-01-02 ENCOUNTER — Telehealth: Payer: Self-pay | Admitting: Family Medicine

## 2020-01-02 NOTE — Telephone Encounter (Signed)
Patient is calling to request Rx refill for Adderall. Patient advised they need to pick a new provider or they can transfer to a new location, since Dr. Dayton Martes is no longer with the practice. Patient verbalized understanding.

## 2020-01-03 NOTE — Telephone Encounter (Signed)
Pt advised that Feb, March, April Rx's for Adderall were sent to pharmacy/she will call them/thx dmf

## 2020-01-23 ENCOUNTER — Telehealth: Payer: Self-pay | Admitting: Family Medicine

## 2020-01-23 NOTE — Telephone Encounter (Signed)
Left patient several messages to call us back to reschedule appointment with Amedeo Kinsman.

## 2020-01-29 ENCOUNTER — Ambulatory Visit: Payer: BC Managed Care – PPO | Admitting: Nurse Practitioner

## 2020-02-07 ENCOUNTER — Encounter: Payer: Self-pay | Admitting: Nurse Practitioner

## 2020-02-07 ENCOUNTER — Telehealth: Payer: Self-pay | Admitting: Nurse Practitioner

## 2020-02-07 ENCOUNTER — Ambulatory Visit: Payer: BC Managed Care – PPO | Admitting: Nurse Practitioner

## 2020-02-07 ENCOUNTER — Other Ambulatory Visit: Payer: Self-pay

## 2020-02-07 VITALS — BP 104/70 | HR 83 | Temp 96.9°F | Resp 15 | Ht 65.5 in | Wt 179.8 lb

## 2020-02-07 DIAGNOSIS — R6882 Decreased libido: Secondary | ICD-10-CM | POA: Diagnosis not present

## 2020-02-07 DIAGNOSIS — F909 Attention-deficit hyperactivity disorder, unspecified type: Secondary | ICD-10-CM

## 2020-02-07 NOTE — Progress Notes (Signed)
PHQ 0 New Patient Office Visit  Subjective:  Patient ID: Mercedes Kramer, female    DOB: 10-10-1991  Age: 29 y.o. MRN: [id number]  CC:  Chief Complaint  Patient presents with  . Establish Care    Transfer of Care    HPI Mercedes Kramer presents to establish care with a new primary care provider. She wants to talk about adjusting her Adderall dose d/t side effects and addressing mild anxiety.  1. ADHD/Anxiety: She has  been taking Adderall since elementary school- gets distracted really easily and can't focus on one task at a time. She stopped it for one year while she was pregnant. She was prescribed 30 mg BID. She noticed when in college and now at work the 30 mg is only effective for 4 hours and then it is gone. Some days she does not feel like she is on it at all.  She wants to take something that will help her focus without "jacking me up". Her heart rate goes up and she feels jittery on Adderall. She wants to know if there is a better drug- or a more longer acting medication. She thinks it may be contributing to GAD. She takes it only 5 days a week and takes at least 30 mg am and after noon 15 mg to get her though until 5pm. Even though she takes the last dose around noon, at night she has trouble winding down. GAD-7:  6. PHQ-9:  0. She was breastfeeding and off  Adderall 2 mos ago. Then she weaned her and started back on Adderall  when she started back to work. She has enough Adderall until May.   2. Libido is gone after childbirth: She wants to know what pill to take to boost her libido. Her daughter, Meryl Dare sleeps through the night. She reports a helpful, attentive husband. She is not on BCP.  She has had 2 menstrual cycles.   Past Medical History:  Diagnosis Date  . Acne   . ADHD (attention deficit hyperactivity disorder)   . Tinea versicolor     No past surgical history on file.  No family history on file.  Social History   Socioeconomic History  . Marital status: Single   Spouse name: Not on file  . Number of children: Not on file  . Years of education: Not on file  . Highest education level: Not on file  Occupational History  . Not on file  Tobacco Use  . Smoking status: Never Smoker  . Smokeless tobacco: Never Used  Substance and Sexual Activity  . Alcohol use: No  . Drug use: No  . Sexual activity: Yes    Partners: Male    Birth control/protection: OCP  Other Topics Concern  . Not on file  Social History Narrative   CNA   Social Determinants of Health   Financial Resource Strain:   . Difficulty of Paying Living Expenses:   Food Insecurity:   . Worried About Programme researcher, broadcasting/film/video in the Last Year:   . Barista in the Last Year:   Transportation Needs:   . Freight forwarder (Medical):   Marland Kitchen Lack of Transportation (Non-Medical):   Physical Activity:   . Days of Exercise per Week:   . Minutes of Exercise per Session:   Stress:   . Feeling of Stress :   Social Connections:   . Frequency of Communication with Friends and Family:   . Frequency of Social Gatherings with Friends and  Family:   . Attends Religious Services:   . Active Member of Clubs or Organizations:   . Attends Archivist Meetings:   Marland Kitchen Marital Status:   Intimate Partner Violence:   . Fear of Current or Ex-Partner:   . Emotionally Abused:   Marland Kitchen Physically Abused:   . Sexually Abused:      Review of Systems  Constitutional: Negative.   HENT: Negative.   Respiratory: Negative.   Cardiovascular: Negative.   Gastrointestinal: Negative.   Genitourinary: Negative.   Musculoskeletal: Negative.   Neurological: Negative.   Hematological: Negative.   Psychiatric/Behavioral:       See HPI. No problems at all with depression. No SI. Yes, to ADHD and anxiety.     Objective:   Today's Vitals: BP 104/70 (BP Location: Left Arm, Patient Position: Sitting, Cuff Size: Normal)   Pulse 83   Temp (!) 96.9 F (36.1 C) (Temporal)   Resp 15   Ht 5' 5.5" (1.664 m)    Wt 179 lb 12.8 oz (81.6 kg)   SpO2 98%   BMI 29.47 kg/m   Physical Exam Constitutional:      Appearance: Normal appearance.  HENT:     Head: Normocephalic and atraumatic.  Eyes:     Pupils: Pupils are equal, round, and reactive to light.  Cardiovascular:     Rate and Rhythm: Normal rate and regular rhythm.     Pulses: Normal pulses.     Heart sounds: Normal heart sounds.  Pulmonary:     Effort: Pulmonary effort is normal.     Breath sounds: Normal breath sounds.  Abdominal:     Palpations: Abdomen is soft.     Tenderness: There is no abdominal tenderness.  Musculoskeletal:        General: Normal range of motion.     Cervical back: Normal range of motion and neck supple.  Skin:    General: Skin is warm and dry.  Neurological:     General: No focal deficit present.     Mental Status: She is alert and oriented to person, place, and time.  Psychiatric:        Mood and Affect: Mood normal.        Behavior: Behavior normal.        Thought Content: Thought content normal.        Judgment: Judgment normal.     Assessment & Plan:   Problem List Items Addressed This Visit      Other   ADHD (attention deficit hyperactivity disorder) - Primary   Relevant Orders   Ambulatory referral to Psychiatry      Outpatient Encounter Medications as of 02/07/2020  Medication Sig  . amphetamine-dextroamphetamine (ADDERALL) 30 MG tablet Take 1 tablet by mouth 2 (two) times daily.  Marland Kitchen amphetamine-dextroamphetamine (ADDERALL) 30 MG tablet Take 1 tablet by mouth 2 (two) times daily.  Marland Kitchen amphetamine-dextroamphetamine (ADDERALL) 30 MG tablet Take 1 tablet by mouth 2 (two) times daily.  . [DISCONTINUED] drospirenone-ethinyl estradiol (YAZ) 3-0.02 MG tablet Take 1 tablet by mouth daily. (Patient not taking: Reported on 11/12/2019)  . [DISCONTINUED] norethindrone (MICRONOR) 0.35 MG tablet Take 1 tablet (0.35 mg total) by mouth daily. (Patient not taking: Reported on 02/07/2020)   No  facility-administered encounter medications on file as of 02/07/2020.   It was really nice to meet you today.   I have placed a referral in to Dr. Shea Evans in psychiatry to help you fine tune the Adderall dose and address anxiety feelings  as well.   See your GYN for contraceptions management and libido concerns post partum. We discussed there is no specific pill for females to increase libido, but with her hormones changing and her menstrual cycles resuming she is in a state of change and that with a new baby can affect her libido.  Follow-up: Return in about 3 months (around 05/08/2020).   Amedeo Kinsman, NP

## 2020-02-07 NOTE — Telephone Encounter (Signed)
Mailbox full. Pt needs to schedule 3 month follow up.

## 2020-02-07 NOTE — Patient Instructions (Addendum)
It was really nice to meet you today.   I have placed a referral in to Dr. Elna Breslow in psychiatry to help you fine tune the Adderall dose and address anxiety feelings as well.   Follow up in 3 mos.

## 2020-02-20 ENCOUNTER — Telehealth: Payer: Self-pay | Admitting: Nurse Practitioner

## 2020-02-20 NOTE — Telephone Encounter (Signed)
Patient is requesting a refill on amphetamine-dextroamphetamine (ADDERALL) 30 MG tablet, Waiting for referral to go through.

## 2020-02-20 NOTE — Telephone Encounter (Signed)
She needs to fill out a controlled substance contract and I will give her refills until she sees Psychiatry.

## 2020-02-20 NOTE — Telephone Encounter (Signed)
I tried to call patient to let her know that she needs to come by office to sign controlled substance contract before Selena Batten can refill. After that Selena Batten will refill until she is able to get in to see psychiatry. VM was full, so I was unable to LM.

## 2020-02-20 NOTE — Telephone Encounter (Signed)
Patient coming in tomorrow to sign.

## 2020-03-09 MED ORDER — AMPHETAMINE-DEXTROAMPHETAMINE 30 MG PO TABS: 30.0000 mg | ORAL_TABLET | Freq: Two times a day (BID) | ORAL | 0 refills | Status: DC

## 2020-03-09 NOTE — Telephone Encounter (Signed)
Yes, I checked her PDMP and medication list and she is due for Adderall. I have asked her to schedule an appt with Psychiatry for continued management.   She  will need to pick up the printed Rx.

## 2020-03-09 NOTE — Telephone Encounter (Signed)
Patient came into office today and signed contract. Patient would like to know if refill can be sent in today?

## 2020-03-09 NOTE — Addendum Note (Signed)
Addended by: Amedeo Kinsman A on: 03/09/2020 05:43 PM   Modules accepted: Orders

## 2020-03-10 ENCOUNTER — Telehealth: Payer: Self-pay | Admitting: Nurse Practitioner

## 2020-03-10 NOTE — Telephone Encounter (Signed)
Rx is up front for patient to pickup and patient aware

## 2020-03-10 NOTE — Telephone Encounter (Signed)
[facility]  amphetamine-dextroamphetamine (ADDERALL) 30 MG tablet. This medication can be sent this way, need to do electronic or a written prescription for patient to bring to pharmacy.

## 2020-03-10 NOTE — Telephone Encounter (Signed)
Rx up front ready for pickup. LM for patient to pickup

## 2020-04-16 ENCOUNTER — Telehealth: Payer: Self-pay | Admitting: Nurse Practitioner

## 2020-04-16 NOTE — Telephone Encounter (Signed)
Pt needs refill on amphetamine-dextroamphetamine (ADDERALL) 30 MG tablet °

## 2020-04-16 NOTE — Telephone Encounter (Signed)
Refill request for adderall, last seen 02-07-20, last filled 03-09-20.  Please advise.

## 2020-04-16 NOTE — Telephone Encounter (Signed)
Did she ever see Dr. Elna Breslow?

## 2020-04-23 ENCOUNTER — Telehealth: Payer: Self-pay | Admitting: Nurse Practitioner

## 2020-04-23 ENCOUNTER — Other Ambulatory Visit: Payer: Self-pay | Admitting: Nurse Practitioner

## 2020-04-23 MED ORDER — AMPHETAMINE-DEXTROAMPHETAMINE 30 MG PO TABS: 30.0000 mg | ORAL_TABLET | Freq: Two times a day (BID) | ORAL | 0 refills | Status: DC

## 2020-04-23 NOTE — Telephone Encounter (Signed)
Let her know it is refilled for one month.

## 2020-04-23 NOTE — Telephone Encounter (Signed)
Refill request for adderall, last seen 02-07-20, last filled 03-09-20.  Please advise.  

## 2020-04-23 NOTE — Telephone Encounter (Signed)
Pt called in wanted to know about her refill for amphetamine-dextroamphetamine (ADDERALL) 30 MG tablet

## 2020-06-10 ENCOUNTER — Other Ambulatory Visit: Payer: Self-pay | Admitting: Nurse Practitioner

## 2020-06-10 DIAGNOSIS — F909 Attention-deficit hyperactivity disorder, unspecified type: Secondary | ICD-10-CM

## 2020-06-10 NOTE — Telephone Encounter (Signed)
Patient is requesting a refill on her amphetamine-dextroamphetamine (ADDERALL) 30 MG tablet.

## 2020-06-10 NOTE — Telephone Encounter (Signed)
Unable to leave message for patient to return call back. It seems she was contacted by psych numerous times, she never returned call so they closed the referral. She would need a new referral. And patient would need to schedule and keep appointment .

## 2020-06-10 NOTE — Telephone Encounter (Signed)
She was advised to get appt with Psych in April.  Where is she with that? I do not see that she has done that. Is there a problem with referrals? Can we assist in any way?

## 2020-06-11 NOTE — Telephone Encounter (Signed)
I spoke to her. She needs a virtual appt with me to address other health concerns. Can you arrange?  I will also place a referral to Dr. Maryruth Bun for the Adderall. She takes it only when she works.

## 2020-06-11 NOTE — Telephone Encounter (Signed)
Appointment has been scheduled.

## 2020-06-17 ENCOUNTER — Encounter: Payer: Self-pay | Admitting: Nurse Practitioner

## 2020-06-17 ENCOUNTER — Telehealth (INDEPENDENT_AMBULATORY_CARE_PROVIDER_SITE_OTHER): Payer: BC Managed Care – PPO | Admitting: Nurse Practitioner

## 2020-06-17 VITALS — Temp 97.1°F | Ht 66.0 in | Wt 172.0 lb

## 2020-06-17 DIAGNOSIS — E785 Hyperlipidemia, unspecified: Secondary | ICD-10-CM | POA: Diagnosis not present

## 2020-06-17 DIAGNOSIS — F339 Major depressive disorder, recurrent, unspecified: Secondary | ICD-10-CM | POA: Diagnosis not present

## 2020-06-17 DIAGNOSIS — F419 Anxiety disorder, unspecified: Secondary | ICD-10-CM

## 2020-06-17 DIAGNOSIS — F909 Attention-deficit hyperactivity disorder, unspecified type: Secondary | ICD-10-CM

## 2020-06-17 DIAGNOSIS — Z6827 Body mass index (BMI) 27.0-27.9, adult: Secondary | ICD-10-CM

## 2020-06-17 MED ORDER — AMPHETAMINE-DEXTROAMPHETAMINE 30 MG PO TABS: 30.0000 mg | ORAL_TABLET | Freq: Two times a day (BID) | ORAL | 0 refills | Status: DC

## 2020-06-17 NOTE — Progress Notes (Signed)
Virtual Visit via Virtual Note  This visit type was conducted due to national recommendations for restrictions regarding the COVID-19 pandemic (e.g. social distancing).  This format is felt to be most appropriate for this patient at this time.  All issues noted in this document were discussed and addressed.  No physical exam was performed (except for noted visual exam findings with Video Visits).   I connected with@ on 06/17/20 at  8:00 AM EDT by a video enabled telemedicine application or telephone and verified that I am speaking with the correct person using two identifiers. Location patient: home Location provider: work or home office Persons participating in the virtual visit: patient, provider  I discussed the limitations, risks, security and privacy concerns of performing an evaluation and management service by telephone and the availability of in person appointments. I also discussed with the patient that there may be a patient responsible charge related to this service. The patient expressed understanding and agreed to proceed.   Reason for visit: Routine f/up for Adderall refill.   HPI: This 29 yo takes Adderall and has been on it since elementary school. She needs the 30 mg twice daily 4 days a week to help her focus at work. This has been her stable dose.  She was referred to Psychiatry and declined the referral as she was feeling better from depression/anxiety that does fluctuate with menstrual cycles. She is requesting her vitamin B12 and D  levels checked. She is due for routine labs.Pap Smear:UTD 2020  HLD/Overweight: Health goal is to lose weight. Trying to diet and exercise. Yes, wt loss group Vision: No- recommended as eyes get tired after looking at screens all day Dentist: UTD  ROS: See pertinent positives and negatives per HPI.  Past Medical History:  Diagnosis Date  . Acne   . ADHD (attention deficit hyperactivity disorder)   . Tinea versicolor     No past surgical  history on file.  No family history on file.  SOCIAL HX: never smoker   Current Outpatient Medications:  .  amphetamine-dextroamphetamine (ADDERALL) 30 MG tablet, Take 1 tablet by mouth 2 (two) times daily., Disp: 60 tablet, Rfl: 0 .  NIKKI 3-0.02 MG tablet, Take 1 tablet by mouth daily., Disp: , Rfl:   EXAM:  VITALS per patient if applicable:  GENERAL: alert, oriented, appears well and in no acute distress  HEENT: atraumatic, conjunctiva clear, no obvious abnormalities on inspection of external nose and ears  NECK: normal movements of the head and neck  LUNGS: on inspection no signs of respiratory distress, breathing rate appears normal, no obvious gross SOB, gasping or wheezing  CV: no obvious cyanosis  MS: moves all visible extremities without noticeable abnormality  PSYCH/NEURO: pleasant and cooperative, no obvious depression or anxiety, speech and thought processing grossly intact  ASSESSMENT AND PLAN:  Discussed the following assessment and plan:  Attention deficit hyperactivity disorder (ADHD), unspecified ADHD type  Hyperlipidemia, unspecified hyperlipidemia type - Plan: Amb Ref to Medical Weight Management  Adult BMI 27.0-27.9 kg/sq m - Plan: Amb Ref to Medical Weight Management, CBC with Differential/Platelet, Comprehensive metabolic panel, Hemoglobin A1c, Lipid panel, TSH  Depression, recurrent (HCC) - Plan: VITAMIN D 25 Hydroxy (Vit-D Deficiency, Fractures), B12 and Folate Panel  Anxiety disorder, unspecified type  No problem-specific Assessment & Plan notes found for this encounter.   I discussed the assessment and treatment plan with the patient. The patient was provided an opportunity to ask questions and all were answered. The patient agreed with  the plan and demonstrated an understanding of the instructions.   The patient was advised to call back or seek an in-person evaluation if the symptoms worsen or if the condition fails to improve as  anticipated.  I provided 15 minutes of non-face-to-face time during this encounter.  Amedeo Kinsman, NP Adult Nurse Practitioner [facility] [employer] [phone]

## 2020-06-18 ENCOUNTER — Other Ambulatory Visit (INDEPENDENT_AMBULATORY_CARE_PROVIDER_SITE_OTHER): Payer: BC Managed Care – PPO

## 2020-06-18 ENCOUNTER — Other Ambulatory Visit: Payer: Self-pay

## 2020-06-18 DIAGNOSIS — F339 Major depressive disorder, recurrent, unspecified: Secondary | ICD-10-CM | POA: Diagnosis not present

## 2020-06-18 DIAGNOSIS — Z6827 Body mass index (BMI) 27.0-27.9, adult: Secondary | ICD-10-CM

## 2020-06-18 LAB — HEMOGLOBIN A1C: Hgb A1c MFr Bld: 5.6 % (ref 4.6–6.5)

## 2020-06-18 LAB — COMPREHENSIVE METABOLIC PANEL
ALT: 16 U/L (ref 0–35)
AST: 12 U/L (ref 0–37)
Albumin: 4.5 g/dL (ref 3.5–5.2)
Alkaline Phosphatase: 67 U/L (ref 39–117)
BUN: 11 mg/dL (ref 6–23)
CO2: 25 mEq/L (ref 19–32)
Calcium: 9.4 mg/dL (ref 8.4–10.5)
Chloride: 105 mEq/L (ref 96–112)
Creatinine, Ser: 0.68 mg/dL (ref 0.40–1.20)
GFR: 102.74 mL/min (ref >60.00)
Glucose, Bld: 106 mg/dL — ABNORMAL HIGH (ref 70–99)
Potassium: 4.3 mEq/L (ref 3.5–5.1)
Sodium: 137 mEq/L (ref 135–145)
Total Bilirubin: 0.5 mg/dL (ref 0.2–1.2)
Total Protein: 7.2 g/dL (ref 6.0–8.3)

## 2020-06-18 LAB — B12 AND FOLATE PANEL
Folate: 10.7 ng/mL (ref >5.9)
Vitamin B-12: 350 pg/mL (ref 211–911)

## 2020-06-18 LAB — CBC WITH DIFFERENTIAL/PLATELET
Basophils Absolute: 0 10*3/uL (ref 0.0–0.1)
Basophils Relative: 0.3 % (ref 0.0–3.0)
Eosinophils Absolute: 0.2 10*3/uL (ref 0.0–0.7)
Eosinophils Relative: 2.8 % (ref 0.0–5.0)
HCT: 38.1 % (ref 36.0–46.0)
Hemoglobin: 12.9 g/dL (ref 12.0–15.0)
Lymphocytes Relative: 32.5 % (ref 12.0–46.0)
Lymphs Abs: 2.6 10*3/uL (ref 0.7–4.0)
MCHC: 33.8 g/dL (ref 30.0–36.0)
MCV: 86.2 fl (ref 78.0–100.0)
Monocytes Absolute: 0.5 10*3/uL (ref 0.1–1.0)
Monocytes Relative: 6.1 % (ref 3.0–12.0)
Neutro Abs: 4.6 10*3/uL (ref 1.4–7.7)
Neutrophils Relative %: 58.3 % (ref 43.0–77.0)
Platelets: 347 10*3/uL (ref 150.0–400.0)
RBC: 4.41 Mil/uL (ref 3.87–5.11)
RDW: 13.6 % (ref 11.5–15.5)
WBC: 7.9 10*3/uL (ref 4.0–10.5)

## 2020-06-18 LAB — LIPID PANEL
Cholesterol: 115 mg/dL (ref 0–200)
HDL: 36.1 mg/dL — ABNORMAL LOW (ref >39.00)
LDL Cholesterol: 51 mg/dL (ref 0–99)
NonHDL: 79.22
Total CHOL/HDL Ratio: 3
Triglycerides: 141 mg/dL (ref 0.0–149.0)
VLDL: 28.2 mg/dL (ref 0.0–40.0)

## 2020-06-18 LAB — TSH: TSH: 0.79 u[IU]/mL (ref 0.35–4.50)

## 2020-06-18 LAB — VITAMIN D 25 HYDROXY (VIT D DEFICIENCY, FRACTURES): VITD: 24.93 ng/mL — ABNORMAL LOW (ref 30.00–100.00)

## 2020-06-20 ENCOUNTER — Encounter: Payer: Self-pay | Admitting: Nurse Practitioner

## 2020-07-15 ENCOUNTER — Encounter: Payer: Self-pay | Admitting: Nurse Practitioner

## 2020-07-15 ENCOUNTER — Telehealth: Payer: Self-pay | Admitting: Nurse Practitioner

## 2020-07-15 MED ORDER — AMPHETAMINE-DEXTROAMPHETAMINE 30 MG PO TABS
30.0000 mg | ORAL_TABLET | Freq: Two times a day (BID) | ORAL | 0 refills | Status: DC
Start: 2020-07-15 — End: 2020-07-16

## 2020-07-15 NOTE — Telephone Encounter (Signed)
I will refill the Adderall today after checking her PDMP [place] narcotic database.  There is no suspicious narcotic activity noted.  Patient does have a current nonopioid controlled substance agreement on file from April 2021.  We had a video visit on 06/17/2020.   Patient has been advised establish care with psychiatrist and reports she has not heard from been not able to do that at this time.  Referral placed to Dr. Maryruth Bun last month.  Will ask Rasheedah to check on the status.

## 2020-07-15 NOTE — Telephone Encounter (Signed)
Pt states that she needs a refill on amphetamine-dextroamphetamine (ADDERALL) 30 MG tablet and states that she has not heard from Dr Maryruth Bun to schedule appt.

## 2020-07-16 MED ORDER — AMPHETAMINE-DEXTROAMPHETAMINE 30 MG PO TABS
30.0000 mg | ORAL_TABLET | Freq: Two times a day (BID) | ORAL | 0 refills | Status: DC
Start: 2020-07-16 — End: 2020-08-19

## 2020-08-17 ENCOUNTER — Encounter: Payer: Self-pay | Admitting: Nurse Practitioner

## 2020-08-18 ENCOUNTER — Telehealth: Payer: Self-pay | Admitting: Nurse Practitioner

## 2020-08-18 NOTE — Telephone Encounter (Signed)
Patient called in for refill for amphetamine-dextroamphetamine (ADDERALL) 30 MG tablet

## 2020-08-18 NOTE — Telephone Encounter (Signed)
When is her Psychiatry appt? She was asked in August to follow through with the Dr. Maryruth Bun referral. Did she find someone else?

## 2020-08-18 NOTE — Telephone Encounter (Signed)
There is a Clinical cytogeneticist message I sent you yesterday about refill request and patient stated that she sees psy in November.

## 2020-08-19 ENCOUNTER — Other Ambulatory Visit: Payer: Self-pay | Admitting: Nurse Practitioner

## 2020-08-19 MED ORDER — AMPHETAMINE-DEXTROAMPHETAMINE 30 MG PO TABS
30.0000 mg | ORAL_TABLET | Freq: Two times a day (BID) | ORAL | 0 refills | Status: DC
Start: 2020-08-19 — End: 2020-09-18

## 2020-08-19 NOTE — Telephone Encounter (Signed)
I refilled her Adderall. The [place] data base- PDMP was checked and no suspicious activity noted. She has an appt with Psychiatrist, Dr. Maryruth Bun in November for further evaluation and management. Please advise her to keep that appt and let her know the medication was refilled. Thank you.

## 2020-09-16 ENCOUNTER — Encounter: Payer: Self-pay | Admitting: Nurse Practitioner

## 2020-09-16 ENCOUNTER — Telehealth: Payer: Self-pay | Admitting: Nurse Practitioner

## 2020-09-16 NOTE — Telephone Encounter (Signed)
Patient called in for refill for amphetamine-dextroamphetamine (ADDERALL) 30 MG tablet 

## 2020-09-17 NOTE — Telephone Encounter (Signed)
She has not been seen since Aug and we need to see her video visit  or in person. She has been referred to Psychiatry for this RX. When is her psychiatry appt?

## 2020-09-17 NOTE — Telephone Encounter (Signed)
There was a message sent to you yesterday that her appt with psy is 09/23/20

## 2020-09-18 ENCOUNTER — Other Ambulatory Visit: Payer: Self-pay | Admitting: Nurse Practitioner

## 2020-09-18 MED ORDER — AMPHETAMINE-DEXTROAMPHETAMINE 30 MG PO TABS: 30.0000 mg | ORAL_TABLET | Freq: Two times a day (BID) | ORAL | 0 refills | Status: DC

## 2020-09-18 NOTE — Telephone Encounter (Signed)
Pt called to check on amphetamine-dextroamphetamine (ADDERALL) 30 MG tablet. Needs ASAP

## 2020-09-18 NOTE — Telephone Encounter (Addendum)
Patient said she called this in on Wednesday and she could have done this visit had she been notified sooner, she says"I   feel like am being stigmatized from this, she say I don't mind the 3 month visits but each month getting this medication filled has been a barrier of some kind. I have set up the appointment with psychiatry as ask , and I have been on this medication since I was in third grade. " I just fel like the barrier is with Selena Batten and me she I don't know what it is. I am willing to do what I need but that won't help me this weekend." Patient did say she takes the adderall daily.

## 2020-09-18 NOTE — Telephone Encounter (Signed)
She needs a video visit for a 3 month visit.

## 2020-09-18 NOTE — Telephone Encounter (Signed)
Mercedes Kramer spoke with patient and sent rx in

## 2020-09-18 NOTE — Telephone Encounter (Signed)
I called her and spoke to her and completed her refill.

## 2020-09-18 NOTE — Telephone Encounter (Signed)
I called and spoke to her. Refilled her Rx. She will follow up with Dr. Maryruth Bun as planned.

## 2020-09-18 NOTE — Telephone Encounter (Signed)
She has found it works better if she takes it daily- tried this new dosing pattern daily this month. She was taking her Adderall four days a week only at work previously. She feels like daily it keeps her at a steady baseline. She has 2 pills left over from her previous RX. She sees Dr. Maryruth Bun soon and will discuss this dose increase with her. She is aware that her prescription was refilled.

## 2020-09-18 NOTE — Telephone Encounter (Signed)
Patient sent mychart message about refill request and I replied to her message thru mychart that she needs an appt before we can refill her medication.

## 2020-10-19 ENCOUNTER — Telehealth: Payer: Self-pay

## 2020-10-19 MED ORDER — AMPHETAMINE-DEXTROAMPHETAMINE 30 MG PO TABS
30.0000 mg | ORAL_TABLET | Freq: Two times a day (BID) | ORAL | 0 refills | Status: DC
Start: 2020-10-19 — End: 2020-10-19

## 2020-10-19 MED ORDER — AMPHETAMINE-DEXTROAMPHETAMINE 30 MG PO TABS
30.0000 mg | ORAL_TABLET | Freq: Two times a day (BID) | ORAL | 0 refills | Status: DC
Start: 2020-10-19 — End: 2024-01-04

## 2020-10-19 NOTE — Telephone Encounter (Signed)
Pt called and made aware that script was sent. She know she will have enough until scheduling with new NP.

## 2020-10-19 NOTE — Telephone Encounter (Signed)
Pt needs a refill on amphetamine-dextroamphetamine (ADDERALL) 30 MG tablet. Pt has an appt with [facility] at the END of January. Please give enough until then!

## 2020-10-19 NOTE — Telephone Encounter (Signed)
Call pt I have [facility] in [facility] so she should have more than enough prior to establishing with new pcp  I looked up patient on [place] Controlled Substances Reporting System PMP AWARE and saw no activity that raised concern of inappropriate use.

## 2020-11-26 ENCOUNTER — Ambulatory Visit (INDEPENDENT_AMBULATORY_CARE_PROVIDER_SITE_OTHER): Payer: BC Managed Care – PPO | Admitting: Adult Health

## 2020-11-26 ENCOUNTER — Encounter: Payer: Self-pay | Admitting: Adult Health

## 2020-11-26 DIAGNOSIS — Z5329 Procedure and treatment not carried out because of patient's decision for other reasons: Secondary | ICD-10-CM

## 2020-11-26 DIAGNOSIS — Z91199 Patient's noncompliance with other medical treatment and regimen due to unspecified reason: Secondary | ICD-10-CM | POA: Insufficient documentation

## 2020-11-26 NOTE — Progress Notes (Signed)
Patient sent message she has covid - canceled, will call back to reschedule to next available appointment for new patient. Seek care for covid if any symptoms severe or worsening at anytime.

## 2021-10-11 ENCOUNTER — Ambulatory Visit: Payer: BC Managed Care – PPO | Admitting: Medical-Surgical

## 2022-03-23 ENCOUNTER — Other Ambulatory Visit (HOSPITAL_COMMUNITY): Payer: Self-pay

## 2022-03-30 ENCOUNTER — Other Ambulatory Visit (HOSPITAL_COMMUNITY): Payer: Self-pay

## 2023-05-10 ENCOUNTER — Other Ambulatory Visit (HOSPITAL_COMMUNITY): Payer: Self-pay

## 2023-06-07 DIAGNOSIS — O0993 Supervision of high risk pregnancy, unspecified, third trimester: Secondary | ICD-10-CM | POA: Insufficient documentation

## 2023-06-07 DIAGNOSIS — O09811 Supervision of pregnancy resulting from assisted reproductive technology, first trimester: Secondary | ICD-10-CM

## 2023-06-17 LAB — OB RESULTS CONSOLE VARICELLA ZOSTER ANTIBODY, IGG: Varicella: IMMUNE

## 2023-06-17 LAB — OB RESULTS CONSOLE HIV ANTIBODY (ROUTINE TESTING): HIV: NONREACTIVE

## 2023-06-17 LAB — OB RESULTS CONSOLE HEPATITIS B SURFACE ANTIGEN: Hepatitis B Surface Ag: NEGATIVE

## 2023-06-17 LAB — OB RESULTS CONSOLE RUBELLA ANTIBODY, IGM: Rubella: IMMUNE

## 2023-06-17 LAB — OB RESULTS CONSOLE RPR: RPR: NONREACTIVE

## 2023-12-19 LAB — OB RESULTS CONSOLE GC/CHLAMYDIA
Chlamydia: NEGATIVE
Neisseria Gonorrhea: NEGATIVE

## 2023-12-19 LAB — OB RESULTS CONSOLE GBS: GBS: NEGATIVE

## 2024-01-01 NOTE — H&P (Signed)
 OB History & Physical    History of Present Illness:  Chief Complaint: Pre-op for repeat cesarean delivery   HPI:  Mercedes Kramer is a 33 y.o. G2P1001 female at [redacted]w[redacted]d dated by a 7 week ultrasound.  Her pregnancy has been complicated by history of cesarean delivery .     She denies contractions.   She denies leakage of fluid.   She denies vaginal bleeding.   She reports fetal movement.     Total weight gain for pregnancy: 19.3 kg (42 lb 9.6 oz)    Obstetrical Problem List: pregnancy Problems (from 06/06/23 to present)       Problem Noted Diagnosed Resolved    Supervision of high risk pregnancy in third trimester (HHS-HCC) 06/07/2023 by Acquanetta Sit, CMA   No    Overview Addendum 12/27/2023 11:12 AM by Ceasar Mons, CMA      33 y.o. G2P1001 at  Patient's last menstrual period was 03/25/2023. inconsistent with  ultrasound @ .Estimated Date of Delivery: 01/18/24 Sex of baby and name:  " "                                Partner:    Judie Grieve Factors complicating this pregnancy  Incomplete heart views on anatomy US Repeat US on 09/26/23 for incomplete heart views LVOT and 4 chamber heart views obtained and appear normal (09/26/2023) Elevated 1hr GTT 11/02/23: 153, 3hr GTT ordered Glucose test passed! (F) 78, (1hr) 116, (2hr) 96, (3hr) 63 Anemia 11/02/23: Hgb 10.4, start ferrous sulfate Previous c/section Indication: failed induction Delivery preference: repeat c/section with Dr Jean Rosenthal Prefers 01/11/2024 for scheduled c/s if possible (family member's birthday) Screening results and needs: NOB:  Medicaid Questionnaire:  []  ACHD Program Depression Score: 0 MBT: A +  Ab screen:   Negative HIV:  Negative  RPR:   NR Hep B: NegativeHep C: NR Pap: 11/22/2021 NILM   G/C:  Rubella: Immune   VZV: Immune TSH: 0.368HgA1C: 5.3 Aneuploidy:  First trimester:  MaternitT21:   Negative  Second trimester (AFP/tetra): negative 28 weeks:  Review Medicaid Questionnaire: []  ACHD Program Depression Score:  4 Blood consent: signed 11/06/23 Hgb:10.4--> pt started on iron supp.   Platelets:339    Glucola:153--> 3hr GTT: 78, 116, 96, 63  Rhogam: n/a 36 weeks:  GBS:neg  G/C:neg/neg   Hgb:10.9  Platelets:339  HIV:neg RPR: N/R   Last [place]:  06/07/2023: Single, viable IUP seen; CRL = 15.23mm = [redacted]w[redacted]d; EDC by [place] 12/2022; FHR 170 bpm; Right ovary WNL; Left ovary CLC seen  09/01/2023: Cervix Visualized Appearance: The cervical length is 4.1 cm,Approach Transabdominal, Funneling absent Right Ovary Visualized Left Ovary Visualized Immunization:   Flu in season - received at work 07/20/23 Tdap at 27-36 weeks - given 11/06/23 RSV at 32-36 weeks -  Contraception Plan:  Feeding Plan:  Labor Plans:                       Maternal Medical History:  Past Medical History  No past medical history on file.     Past Surgical History       Past Surgical History:  Procedure Laterality Date   CESAREAN DELIVERY N/A 09/13/2019    Procedure: CESAREAN DELIVERY ONLY; INCLUDING POSTPARTUM CARE;  Surgeon: Schaumberg, [place] Enns, MD;  Location: [facility];  Service: Gynecology;  Laterality: N/A;   CESAREAN SECTION   09/13/2019  Allergies  No Known Allergies          Prior to Admission medications   Medication Sig Taking? Last Dose  ferrous sulfate 325 (65 FE) MG EC tablet Take 1 tablet (325 mg total) by mouth every Monday, Wednesday, and Friday Yes Taking  prenatal vit-iron fum-folic ac (PRENAVITE) tablet Take 1 tablet by mouth once daily Yes Taking  calcium carbonate (TUMS ORAL) Take by mouth Patient not taking: Reported on 01/01/2024   Not Taking  cholecalciferol (VITAMIN D3) 1000 unit capsule Take 1,000 Units by mouth once daily Patient not taking: Reported on 01/01/2024   Not Taking  clotrimazole-betamethasone (LOTRISONE) 1-0.05 % cream Apply topically 2 (two) times daily Patient not taking: Reported on 01/01/2024   Not Taking  ferrous sulfate (SLOW FE ORAL) Take by mouth Patient not taking: Reported  on 01/01/2024   Not Taking  folic acid (FOLVITE) 400 MCG tablet Take 400 mcg by mouth once daily Patient not taking: Reported on 01/01/2024   Not Taking  letrozole [facility]) 2.5 mg tablet Take 1 tab Day 3-7 Patient not taking: Reported on 01/01/2024   Not Taking  linaCLOtide (LINZESS) 145 mcg capsule TAKE 1 CAPSULE (145 MCG TOTAL) BY MOUTH 3 (THREE) TIMES A WEEK Patient not taking: Reported on 01/01/2024   Not Taking  medroxyPROGESTERone (PROVERA) 10 MG tablet Take 1 tablet (10 mg total) by mouth once daily for 10 days Call the office if no bleeding within 1 week of completing pills Patient not taking: Reported on 02/17/2023      progesterone (PROMETRIUM) 200 MG capsule Take 1 capsule (200 mg total) by mouth at bedtime Patient not taking: Reported on 01/01/2024   Not Taking  UNABLE TO FIND Vit D injections weekly Patient not taking: Reported on 01/01/2024   Not Taking                       OB History  Gravida Para Term Preterm AB Living  2 1 1  0 0 1  SAB IAB Ectopic Molar Multiple Live Births   0 0 0 0 0 1     # Outcome Date GA Lbr Len/2nd Weight Sex Type Anes PTL Lv  2 Current                    1 Term 09/13/19 [redacted]w[redacted]d   3.81 kg (8 lb 6.4 oz) F CS-LTranv CSE   LIV     Complications: Fetal Intolerance      Prenatal care site: [facility]   Social History: She  reports that she has never smoked. She has never used smokeless tobacco. She reports that she does not currently use alcohol. She reports that she does not use drugs.   Family History: family history includes Breast cancer in her maternal grandmother; Heart disease in her paternal grandmother; High blood pressure (Hypertension) in her father; Hyperlipidemia (Elevated cholesterol) in her paternal grandfather; Melanoma in her maternal grandfather, maternal grandmother, and paternal grandfather; Ovarian cancer in her paternal uncle; Skin cancer in her mother; Thyroid disease in her paternal aunt and paternal uncle.    Review of  Systems  Constitutional: Negative.   HENT: Negative.    Eyes: Negative.   Respiratory: Negative.    Cardiovascular: Negative.   Gastrointestinal: Negative.   Genitourinary: Negative.   Musculoskeletal: Negative.   Skin: Negative.   Neurological: Negative.   Endo/Heme/Allergies: Negative.   Psychiatric/Behavioral: Negative.        Physical Exam:  BP 107/74   Pulse 90   Ht 167.6 cm (5\' 6" )   Wt 95.5 kg (210 lb 9.6 oz)   LMP 03/25/2023   BMI 33.99 kg/m   Physical Exam Constitutional:      General: She is not in acute distress.    Appearance: Normal appearance.  HENT:     Head: Normocephalic and atraumatic.  Eyes:     General: No scleral icterus.    Conjunctiva/sclera: Conjunctivae normal.  Cardiovascular:     Rate and Rhythm: Normal rate and regular rhythm.     Heart sounds: No murmur heard.    No friction rub. No gallop.  Pulmonary:     Effort: Pulmonary effort is normal. No respiratory distress.     Breath sounds: Normal breath sounds. No wheezing, rhonchi or rales.  Abdominal:     General: Bowel sounds are normal. There is no distension.     Palpations: Abdomen is soft. There is mass (gravid, NT).     Tenderness: There is no abdominal tenderness. There is no guarding or rebound.  Musculoskeletal:        General: No swelling. Normal range of motion.  Neurological:     General: No focal deficit present.     Mental Status: She is oriented to person, place, and time.     Cranial Nerves: No cranial nerve deficit.  Skin:    General: Skin is warm and dry.     Findings: No lesion.  Psychiatric:        Mood and Affect: Mood normal.        Behavior: Behavior normal.        Judgment: Judgment normal.  Vitals and nursing note reviewed.        Pertinent Results:  Prenatal Labs Blood type/Rh A positive (06/07/2023)  Antibody screen negative (06/07/2023)  Rubella Immune  (06/07/2023)  Varicella Immune (06/07/2023)    RPR NR (06/07/2023, 11/02/2023)  HBsAg negative (06/07/2023)   HIV negative (06/07/2023, 11/06/2023)  HepC Negative (06/07/2023)  GC negative (06/07/2023, 12/19/2023)  Chlamydia negative (06/07/2023. 12/19/2023)  Genetic screening Diploid (06/22/2023)  1 hour GTT 153   3 hour GTT 78, 116, 96, 63  GBS negative on 12/19/2023      Assessment:  Mercedes Kramer is a 33 y.o. G2P1001 female at [redacted]w[redacted]d with history of c-section, desires repeat.    Plan:  Admit to Labor & Delivery  CBC, T&S, NPO, IVF GBS negative.   Consents reviewed and signed. Scheduled for 01/11/2024.    Rubye Oaks, MD 01/01/2024 12:12 PM

## 2024-01-08 ENCOUNTER — Encounter: Payer: Self-pay | Admitting: Urgent Care

## 2024-01-08 ENCOUNTER — Other Ambulatory Visit: Payer: Self-pay

## 2024-01-08 ENCOUNTER — Encounter
Admission: RE | Admit: 2024-01-08 | Discharge: 2024-01-08 | Disposition: A | Discharge status: Home / Self Care | Source: Ambulatory Visit | Attending: Obstetrics and Gynecology | Admitting: Obstetrics and Gynecology

## 2024-01-08 ENCOUNTER — Encounter
Admission: RE | Admit: 2024-01-08 | Discharge: 2024-01-08 | Disposition: A | Discharge status: Home / Self Care | Payer: BC Managed Care – PPO | Source: Ambulatory Visit | Attending: Obstetrics and Gynecology | Admitting: Obstetrics and Gynecology

## 2024-01-08 DIAGNOSIS — Z01812 Encounter for preprocedural laboratory examination: Secondary | ICD-10-CM | POA: Insufficient documentation

## 2024-01-08 DIAGNOSIS — O34219 Maternal care for unspecified type scar from previous cesarean delivery: Secondary | ICD-10-CM | POA: Insufficient documentation

## 2024-01-08 DIAGNOSIS — Z3A00 Weeks of gestation of pregnancy not specified: Secondary | ICD-10-CM | POA: Insufficient documentation

## 2024-01-08 HISTORY — DX: Anesthesia of skin: R20.0

## 2024-01-08 HISTORY — DX: Other complications of anesthesia, initial encounter: T88.59XA

## 2024-01-08 HISTORY — DX: Anemia, unspecified: D64.9

## 2024-01-08 LAB — TYPE AND SCREEN
ABO/RH(D): A POS
Antibody Screen: NEGATIVE
Extend sample reason: UNDETERMINED

## 2024-01-08 LAB — RAPID HIV SCREEN (HIV 1/2 AB+AG)
HIV 1/2 Antibodies: NONREACTIVE
HIV-1 P24 Antigen - HIV24: NONREACTIVE

## 2024-01-08 LAB — CBC
HCT: 32.6 % — ABNORMAL LOW (ref 36.0–46.0)
Hemoglobin: 10.9 g/dL — ABNORMAL LOW (ref 12.0–15.0)
MCH: 29.1 pg (ref 26.0–34.0)
MCHC: 33.4 g/dL (ref 30.0–36.0)
MCV: 86.9 fL (ref 80.0–100.0)
Platelets: 260 10*3/uL (ref 150–400)
RBC: 3.75 MIL/uL — ABNORMAL LOW (ref 3.87–5.11)
RDW: 15.4 % (ref 11.5–15.5)
WBC: 10.4 10*3/uL (ref 4.0–10.5)
nRBC: 0 % (ref 0.0–0.2)

## 2024-01-08 NOTE — Patient Instructions (Addendum)
 Your procedure is scheduled on: Thursday 01/11/24    Report to the Registration Desk on the 1st floor of the [facility] at 6:00 am Free Valet parking is available.  If your arrival time is 6:00 am, do not arrive before that time as the [facility] entrance doors do not open until 6:00 am.  REMEMBER: Instructions that are not followed completely may result in serious medical risk, up to and including death; or upon the discretion of your surgeon and anesthesiologist your surgery may need to be rescheduled.  Do not eat food or drink any liquids after midnight the night before surgery.  No gum chewing or hard candies.  TAKE ONLY THESE MEDICATIONS THE MORNING OF SURGERY WITH A SIP OF WATER:  none  No Alcohol for 24 hours before or after surgery.  No Smoking including e-cigarettes for 24 hours before surgery.  No chewable tobacco products for at least 6 hours before surgery.  No nicotine patches on the day of surgery.  Do not use any "recreational" drugs for at least a week (preferably 2 weeks) before your surgery.  Please be advised that the combination of cocaine and anesthesia may have negative outcomes, up to and including death. If you test positive for cocaine, your surgery will be cancelled.  On the morning of surgery brush your teeth with toothpaste and water, you may rinse your mouth with mouthwash if you wish. Do not swallow any toothpaste or mouthwash.  Use CHG Soap or wipes as directed on instruction sheet.  Do not wear lotions, powders, or perfumes.   Do not shave body hair from the neck down 48 hours before surgery.  Wear comfortable clothing (specific to your surgery type) to the hospital.  Do not wear jewelry, make-up, hairpins, clips or nail polish.  For welded (permanent) jewelry: bracelets, anklets, waist bands, etc.  Please have this removed prior to surgery.  If it is not removed, there is a chance that hospital personnel will need to cut it off on the day of  surgery. Contact lenses, hearing aids and dentures may not be worn into surgery.  Do not bring valuables to the hospital. [facility] is not responsible for any missing/lost belongings or valuables.   Notify your doctor if there is any change in your medical condition (cold, fever, infection).  If you are being discharged the day of surgery, you will not be allowed to drive home. You will need a responsible individual to drive you home and stay with you for 24 hours after surgery.   If you are taking public transportation, you will need to have a responsible individual with you.  If you are being admitted to the hospital overnight, leave your suitcase in the car. After surgery it may be brought to your room.  In case of increased patient census, it may be necessary for you, the patient, to continue your postoperative care in the Same Day Surgery department.  After surgery, you can help prevent lung complications by doing breathing exercises.  Take deep breaths and cough every 1-2 hours. Your doctor may order a device called an Incentive Spirometer to help you take deep breaths. If you ar having an abdominal surgery, when coughing or sneezing, hold a pillow firmly against your incision with both hands. This is called "splinting." Doing this helps protect your incision. It also decreases belly discomfort.  Inpatient Visitation:    Visiting hours are 7 a.m. to 8 p.m. Up to four visitors are allowed at one  time in a patient room. The visitors may rotate out with other people during the day. One designated support person (adult) may remain overnight.  Due to an increase in RSV and influenza rates and associated hospitalizations, children ages 70 and under will not be able to visit patients in [facility]. Masks continue to be strongly recommended.  Please call the Pre-admissions Testing Dept. at [phone] if you have any questions about these instructions.     Preparing for  Surgery with CHLORHEXIDINE GLUCONATE (CHG) Soap  Chlorhexidine Gluconate (CHG) Soap  o An antiseptic cleaner that kills germs and bonds with the skin to continue killing germs even after washing  o Used for showering the night before surgery and morning of surgery  Before surgery, you can play an important role by reducing the number of germs on your skin.  CHG (Chlorhexidine gluconate) soap is an antiseptic cleanser which kills germs and bonds with the skin to continue killing germs even after washing.  Please do not use if you have an allergy to CHG or antibacterial soaps. If your skin becomes reddened/irritated stop using the CHG.  1. Shower the NIGHT BEFORE SURGERY and the MORNING OF SURGERY with CHG soap.  2. If you choose to wash your hair, wash your hair first as usual with your normal shampoo.  3. After shampooing, rinse your hair and body thoroughly to remove the shampoo.  4. Use CHG as you would any other liquid soap. You can apply CHG directly to the skin and wash gently with a scrungie or a clean washcloth.  5. Apply the CHG soap to your body only from the neck down. Do not use on open wounds or open sores. Avoid contact with your eyes, ears, mouth, breasts and genitals (private parts). Wash face, breasts and genitals (private parts) with your normal soap.  6. Wash thoroughly, paying special attention to the area where your surgery will be performed.  7. Thoroughly rinse your body with warm water.  8. Do not shower/wash with your normal soap after using and rinsing off the CHG soap.  9. Pat yourself dry with a clean towel.  10. Wear clean pajamas to bed the night before surgery.  12. Place clean sheets on your bed the night of your first shower and do not sleep with pets.  13. Shower again with the CHG soap on the day of surgery prior to arriving at the hospital.  14. Do not apply any deodorants/lotions/powders.  15. Please wear clean clothes to the hospital.

## 2024-01-09 LAB — RPR: RPR Ser Ql: NONREACTIVE

## 2024-01-10 MED ORDER — LACTATED RINGERS IV SOLN: INTRAVENOUS | Status: DC

## 2024-01-10 MED ORDER — LACTATED RINGERS IV BOLUS
1000.0000 mL | Freq: Once | INTRAVENOUS | Status: AC
  Administered 2024-01-11: 1000 mL via INTRAVENOUS

## 2024-01-10 MED ORDER — BUPIVACAINE HCL (PF) 0.5 % IJ SOLN
5.0000 mL | Freq: Once | INTRAMUSCULAR | Status: DC
Start: 2024-01-11 — End: 2024-01-11
  Filled 2024-01-10: qty 10

## 2024-01-10 MED ORDER — SOD CITRATE-CITRIC ACID 500-334 MG/5ML PO SOLN
30.0000 mL | ORAL | Status: AC
  Administered 2024-01-11: 30 mL via ORAL

## 2024-01-10 MED ORDER — BUPIVACAINE 0.25 % ON-Q PUMP DUAL CATH 400 ML
400.0000 mL | INJECTION | Status: DC
  Filled 2024-01-10: qty 400

## 2024-01-10 MED ORDER — CEFAZOLIN SODIUM-DEXTROSE 2-4 GM/100ML-% IV SOLN
2.0000 g | INTRAVENOUS | Status: AC
  Administered 2024-01-11: 2 g via INTRAVENOUS
  Filled 2024-01-10: qty 100

## 2024-01-10 MED ORDER — BUPIVACAINE HCL (PF) 0.5 % IJ SOLN
5.0000 mL | Freq: Once | INTRAMUSCULAR | Status: DC
  Filled 2024-01-10: qty 10

## 2024-01-11 ENCOUNTER — Inpatient Hospital Stay: Admitting: Certified Registered"

## 2024-01-11 ENCOUNTER — Encounter: Payer: Self-pay | Admitting: Obstetrics and Gynecology

## 2024-01-11 ENCOUNTER — Inpatient Hospital Stay
Admission: RE | Admit: 2024-01-11 | Discharge: 2024-01-13 | DRG: 787 | Disposition: A | Discharge status: Home / Self Care | Payer: 59 | Attending: Obstetrics | Admitting: Obstetrics

## 2024-01-11 ENCOUNTER — Other Ambulatory Visit: Payer: Self-pay

## 2024-01-11 ENCOUNTER — Encounter
Admission: RE | Disposition: A | Discharge status: Home / Self Care | Payer: Self-pay | Source: Home / Self Care | Attending: Obstetrics and Gynecology

## 2024-01-11 DIAGNOSIS — O09811 Supervision of pregnancy resulting from assisted reproductive technology, first trimester: Secondary | ICD-10-CM

## 2024-01-11 DIAGNOSIS — O9081 Anemia of the puerperium: Secondary | ICD-10-CM | POA: Diagnosis not present

## 2024-01-11 DIAGNOSIS — O34219 Maternal care for unspecified type scar from previous cesarean delivery: Principal | ICD-10-CM | POA: Diagnosis present

## 2024-01-11 DIAGNOSIS — Z3A39 39 weeks gestation of pregnancy: Secondary | ICD-10-CM | POA: Diagnosis not present

## 2024-01-11 DIAGNOSIS — O99824 Streptococcus B carrier state complicating childbirth: Secondary | ICD-10-CM | POA: Diagnosis present

## 2024-01-11 DIAGNOSIS — O34211 Maternal care for low transverse scar from previous cesarean delivery: Principal | ICD-10-CM | POA: Diagnosis present

## 2024-01-11 DIAGNOSIS — Z8249 Family history of ischemic heart disease and other diseases of the circulatory system: Secondary | ICD-10-CM | POA: Diagnosis not present

## 2024-01-11 DIAGNOSIS — D62 Acute posthemorrhagic anemia: Secondary | ICD-10-CM | POA: Diagnosis not present

## 2024-01-11 DIAGNOSIS — B951 Streptococcus, group B, as the cause of diseases classified elsewhere: Secondary | ICD-10-CM | POA: Diagnosis present

## 2024-01-11 LAB — ABO/RH: ABO/RH(D): A POS

## 2024-01-11 SURGERY — Surgical Case
Anesthesia: Spinal

## 2024-01-11 MED ORDER — SODIUM CHLORIDE 0.9% FLUSH: 3.0000 mL | INTRAVENOUS | Status: DC | PRN

## 2024-01-11 MED ORDER — DEXAMETHASONE SODIUM PHOSPHATE 10 MG/ML IJ SOLN
INTRAMUSCULAR | Status: AC
  Filled 2024-01-11: qty 1

## 2024-01-11 MED ORDER — SENNOSIDES-DOCUSATE SODIUM 8.6-50 MG PO TABS
2.0000 | ORAL_TABLET | ORAL | Status: DC
  Administered 2024-01-11 – 2024-01-13 (×3): 2 via ORAL
  Filled 2024-01-11 (×3): qty 2

## 2024-01-11 MED ORDER — PRENATAL MULTIVITAMIN CH
1.0000 | ORAL_TABLET | Freq: Every day | ORAL | Status: DC
  Administered 2024-01-11 – 2024-01-13 (×3): 1 via ORAL
  Filled 2024-01-11 (×3): qty 1

## 2024-01-11 MED ORDER — MENTHOL 3 MG MT LOZG: 1.0000 | LOZENGE | OROMUCOSAL | Status: DC | PRN

## 2024-01-11 MED ORDER — PHENYLEPHRINE HCL-NACL 20-0.9 MG/250ML-% IV SOLN
INTRAVENOUS | Status: AC
  Filled 2024-01-11: qty 250

## 2024-01-11 MED ORDER — OXYTOCIN-SODIUM CHLORIDE 30-0.9 UT/500ML-% IV SOLN: 2.5000 [IU]/h | INTRAVENOUS | Status: AC

## 2024-01-11 MED ORDER — OXYCODONE-ACETAMINOPHEN 5-325 MG PO TABS
1.0000 | ORAL_TABLET | ORAL | Status: DC | PRN
  Administered 2024-01-12 – 2024-01-13 (×5): 1 via ORAL
  Filled 2024-01-11 (×5): qty 1

## 2024-01-11 MED ORDER — BUPIVACAINE HCL (PF) 0.5 % IJ SOLN
INTRAMUSCULAR | Status: DC | PRN
  Administered 2024-01-11: 10 mL

## 2024-01-11 MED ORDER — WITCH HAZEL-GLYCERIN EX PADS: 1.0000 | MEDICATED_PAD | CUTANEOUS | Status: DC | PRN

## 2024-01-11 MED ORDER — KETOROLAC TROMETHAMINE 30 MG/ML IJ SOLN
30.0000 mg | Freq: Four times a day (QID) | INTRAMUSCULAR | Status: AC | PRN
  Administered 2024-01-11 (×2): 30 mg via INTRAVENOUS
  Filled 2024-01-11 (×3): qty 1

## 2024-01-11 MED ORDER — FERROUS SULFATE 325 (65 FE) MG PO TABS
325.0000 mg | ORAL_TABLET | Freq: Two times a day (BID) | ORAL | Status: DC
Start: 2024-01-11 — End: 2024-01-13
  Administered 2024-01-11 – 2024-01-13 (×4): 325 mg via ORAL
  Filled 2024-01-11 (×4): qty 1

## 2024-01-11 MED ORDER — OXYTOCIN-SODIUM CHLORIDE 30-0.9 UT/500ML-% IV SOLN
INTRAVENOUS | Status: DC | PRN
  Administered 2024-01-11: 600 m[IU]/min via INTRAVENOUS

## 2024-01-11 MED ORDER — DIPHENHYDRAMINE HCL 50 MG/ML IJ SOLN: 12.5000 mg | INTRAMUSCULAR | Status: DC | PRN

## 2024-01-11 MED ORDER — BUPIVACAINE IN DEXTROSE 0.75-8.25 % IT SOLN
INTRATHECAL | Status: DC | PRN
  Administered 2024-01-11: 1.6 mL via INTRATHECAL

## 2024-01-11 MED ORDER — MORPHINE SULFATE (PF) 0.5 MG/ML IJ SOLN
INTRAMUSCULAR | Status: AC
  Filled 2024-01-11: qty 10

## 2024-01-11 MED ORDER — DEXAMETHASONE SODIUM PHOSPHATE 10 MG/ML IJ SOLN
INTRAMUSCULAR | Status: DC | PRN
  Administered 2024-01-11: 8 mg via INTRAVENOUS

## 2024-01-11 MED ORDER — PHENYLEPHRINE HCL-NACL 20-0.9 MG/250ML-% IV SOLN
INTRAVENOUS | Status: DC | PRN
  Administered 2024-01-11: 40 ug/min via INTRAVENOUS

## 2024-01-11 MED ORDER — SOD CITRATE-CITRIC ACID 500-334 MG/5ML PO SOLN
ORAL | Status: AC
  Filled 2024-01-11: qty 15

## 2024-01-11 MED ORDER — ACETAMINOPHEN 500 MG PO TABS
1000.0000 mg | ORAL_TABLET | Freq: Four times a day (QID) | ORAL | Status: AC
  Administered 2024-01-11 – 2024-01-12 (×4): 1000 mg via ORAL
  Filled 2024-01-11 (×4): qty 2

## 2024-01-11 MED ORDER — OXYCODONE-ACETAMINOPHEN 5-325 MG PO TABS: 2.0000 | ORAL_TABLET | ORAL | Status: DC | PRN

## 2024-01-11 MED ORDER — FENTANYL CITRATE (PF) 100 MCG/2ML IJ SOLN
INTRAMUSCULAR | Status: AC
  Filled 2024-01-11: qty 2

## 2024-01-11 MED ORDER — DIBUCAINE (PERIANAL) 1 % EX OINT: 1.0000 | TOPICAL_OINTMENT | CUTANEOUS | Status: DC | PRN

## 2024-01-11 MED ORDER — DIPHENHYDRAMINE HCL 25 MG PO CAPS: 25.0000 mg | ORAL_CAPSULE | Freq: Four times a day (QID) | ORAL | Status: DC | PRN

## 2024-01-11 MED ORDER — LACTATED RINGERS IV SOLN
INTRAVENOUS | Status: DC
Start: 2024-01-11 — End: 2024-01-13

## 2024-01-11 MED ORDER — MEPERIDINE HCL 25 MG/ML IJ SOLN: 6.2500 mg | INTRAMUSCULAR | Status: DC | PRN

## 2024-01-11 MED ORDER — NALOXONE HCL 4 MG/10ML IJ SOLN: 1.0000 ug/kg/h | INTRAVENOUS | Status: DC | PRN

## 2024-01-11 MED ORDER — DIPHENHYDRAMINE HCL 25 MG PO CAPS: 25.0000 mg | ORAL_CAPSULE | ORAL | Status: DC | PRN

## 2024-01-11 MED ORDER — SIMETHICONE 80 MG PO CHEW
80.0000 mg | CHEWABLE_TABLET | Freq: Three times a day (TID) | ORAL | Status: DC
  Administered 2024-01-11: 80 mg via ORAL
  Filled 2024-01-11: qty 1

## 2024-01-11 MED ORDER — FENTANYL CITRATE (PF) 100 MCG/2ML IJ SOLN
INTRAMUSCULAR | Status: DC | PRN
  Administered 2024-01-11: 10 ug via INTRATHECAL

## 2024-01-11 MED ORDER — ONDANSETRON HCL 4 MG/2ML IJ SOLN
INTRAMUSCULAR | Status: DC | PRN
  Administered 2024-01-11: 4 mg via INTRAVENOUS

## 2024-01-11 MED ORDER — ONDANSETRON HCL 4 MG/2ML IJ SOLN
INTRAMUSCULAR | Status: AC
  Filled 2024-01-11: qty 2

## 2024-01-11 MED ORDER — OXYTOCIN-SODIUM CHLORIDE 30-0.9 UT/500ML-% IV SOLN
INTRAVENOUS | Status: AC
Start: 2024-01-11 — End: 2024-01-11
  Filled 2024-01-11: qty 500

## 2024-01-11 MED ORDER — KETOROLAC TROMETHAMINE 30 MG/ML IJ SOLN: 30.0000 mg | Freq: Four times a day (QID) | INTRAMUSCULAR | Status: AC | PRN

## 2024-01-11 MED ORDER — OXYCODONE HCL 5 MG PO TABS
5.0000 mg | ORAL_TABLET | Freq: Four times a day (QID) | ORAL | Status: DC | PRN
  Administered 2024-01-11 – 2024-01-12 (×2): 5 mg via ORAL
  Filled 2024-01-11 (×2): qty 1

## 2024-01-11 MED ORDER — OXYTOCIN-SODIUM CHLORIDE 30-0.9 UT/500ML-% IV SOLN
INTRAVENOUS | Status: AC
  Filled 2024-01-11: qty 500

## 2024-01-11 MED ORDER — BUPIVACAINE 0.25 % ON-Q PUMP DUAL CATH 400 ML
400.0000 mL | INJECTION | Status: DC
  Filled 2024-01-11: qty 400

## 2024-01-11 MED ORDER — SCOPOLAMINE 1 MG/3DAYS TD PT72: 1.0000 | MEDICATED_PATCH | Freq: Once | TRANSDERMAL | Status: DC

## 2024-01-11 MED ORDER — NALOXONE HCL 0.4 MG/ML IJ SOLN: 0.4000 mg | INTRAMUSCULAR | Status: DC | PRN

## 2024-01-11 MED ORDER — IBUPROFEN 600 MG PO TABS
600.0000 mg | ORAL_TABLET | Freq: Four times a day (QID) | ORAL | Status: DC
Start: 2024-01-12 — End: 2024-01-13
  Administered 2024-01-12 – 2024-01-13 (×5): 600 mg via ORAL
  Filled 2024-01-11 (×5): qty 1

## 2024-01-11 MED ORDER — ALUM & MAG HYDROXIDE-SIMETH 200-200-20 MG/5ML PO SUSP
15.0000 mL | Freq: Three times a day (TID) | ORAL | Status: DC | PRN
  Administered 2024-01-11: 15 mL via ORAL
  Filled 2024-01-11 (×3): qty 30

## 2024-01-11 MED ORDER — COCONUT OIL OIL
1.0000 | TOPICAL_OIL | Status: DC | PRN
Start: 2024-01-11 — End: 2024-01-13

## 2024-01-11 MED ORDER — ONDANSETRON HCL 4 MG/2ML IJ SOLN: 4.0000 mg | Freq: Three times a day (TID) | INTRAMUSCULAR | Status: DC | PRN

## 2024-01-11 SURGICAL SUPPLY — 30 items
BENZOIN TINCTURE PRP APPL 2/3 (GAUZE/BANDAGES/DRESSINGS) ×1 IMPLANT
CATH KIT ON-Q SILVERSOAK 5 (CATHETERS) ×2 IMPLANT
CATH KIT ON-Q SILVERSOAK 5IN (CATHETERS) ×2 IMPLANT
DERMABOND ADVANCED .7 DNX12 (GAUZE/BANDAGES/DRESSINGS) ×1 IMPLANT
DERMABOND ADVANCED .7 DNX6 (GAUZE/BANDAGES/DRESSINGS) IMPLANT
DRSG OPSITE POSTOP 4X10 (GAUZE/BANDAGES/DRESSINGS) ×1 IMPLANT
DRSG OPSITE POSTOP 4X12 (GAUZE/BANDAGES/DRESSINGS) IMPLANT
DRSG TELFA 3X8 NADH STRL (GAUZE/BANDAGES/DRESSINGS) ×1 IMPLANT
ELECT CAUTERY BLADE 6.4 (BLADE) ×1 IMPLANT
ELECT REM PT RETURN 9FT ADLT (ELECTROSURGICAL) ×1 IMPLANT
ELECTRODE REM PT RTRN 9FT ADLT (ELECTROSURGICAL) ×1 IMPLANT
GAUZE SPONGE 4X4 12PLY STRL (GAUZE/BANDAGES/DRESSINGS) ×1 IMPLANT
GLOVE BIO SURGEON STRL SZ7 (GLOVE) ×1 IMPLANT
GLOVE INDICATOR 7.5 STRL GRN (GLOVE) ×1 IMPLANT
GOWN STRL REUS W/ TWL LRG LVL3 (GOWN DISPOSABLE) ×3 IMPLANT
MANIFOLD NEPTUNE II (INSTRUMENTS) ×1 IMPLANT
MAT PREVALON FULL STRYKER (MISCELLANEOUS) ×1 IMPLANT
NS IRRIG 1000ML POUR BTL (IV SOLUTION) ×1 IMPLANT
PACK C SECTION AR (MISCELLANEOUS) ×1 IMPLANT
PAD OB MATERNITY 11 LF (PERSONAL CARE ITEMS) ×2 IMPLANT
PAD PREP OB/GYN DISP 24X41 (PERSONAL CARE ITEMS) ×1 IMPLANT
SCRUB CHG 4% DYNA-HEX 4OZ (MISCELLANEOUS) ×1 IMPLANT
STAPLER INSORB 30 2030 C-SECTI (MISCELLANEOUS) IMPLANT
STRIP CLOSURE SKIN 1/2X4 (GAUZE/BANDAGES/DRESSINGS) ×1 IMPLANT
SUT MNCRL 4-0 27 PS-2 XMFL (SUTURE) ×1 IMPLANT
SUT PDS AB 1 TP1 96 (SUTURE) ×1 IMPLANT
SUT VIC AB 0 CTX36XBRD ANBCTRL (SUTURE) ×2 IMPLANT
SUTURE MNCRL 4-0 27XMF (SUTURE) ×1 IMPLANT
TRAP FLUID SMOKE EVACUATOR (MISCELLANEOUS) ×1 IMPLANT
WATER STERILE IRR 500ML POUR (IV SOLUTION) ×1 IMPLANT

## 2024-01-11 NOTE — Transfer of Care (Signed)
 Immediate Anesthesia Transfer of Care Note  Patient: Mercedes Kramer  Procedure(s) Performed: REPEAT CESAREAN SECTION  Patient Location: Mother/Baby  Anesthesia Type:Spinal  Level of Consciousness: awake, alert , and oriented  Airway & Oxygen Therapy: Patient Spontanous Breathing and Patient connected to face mask oxygen  Post-op Assessment: Report given to RN and Post -op Vital signs reviewed and stable  Post vital signs: Reviewed and stable  Last Vitals:  Vitals Value Taken Time  BP 122/66   Temp    Pulse 92   Resp 12   SpO2 100     Last Pain:  Vitals:   01/11/24 0627  TempSrc: Oral         Complications: No notable events documented.

## 2024-01-11 NOTE — Discharge Summary (Signed)
 Postpartum Discharge Summary  Patient Name: Mercedes Kramer DOB: [birth year] MRN: [id number]  Date of admission: 01/11/2024 Delivery date:01/11/2024 Delivering provider: Thomasene Mohair D Date of discharge: 01/13/2024  Primary OB: [facility] QIO:[id number] last menstrual period was 03/25/2023. EDC Estimated Date of Delivery: 01/18/24 Gestational Age at Delivery: [redacted]w[redacted]d   Admitting diagnosis: Indication for care in labor or delivery [O75.9] Intrauterine pregnancy: [redacted]w[redacted]d     Secondary diagnosis:   Principal Problem:   Indication for care in labor or delivery Active Problems:   History of cesarean delivery affecting pregnancy   [redacted] weeks gestation of pregnancy   Positive GBS test   Pregnancy resulting from assisted reproductive technology in first trimester   Cesarean delivery delivered   Discharge Diagnosis: Term Pregnancy Delivered      Hospital course: Sceduled C/S   33 y.o. yo G2P2002 at [redacted]w[redacted]d was admitted to the hospital 01/11/2024 for scheduled cesarean section with the following indication:Elective Repeat.Delivery details are as follows:  Membrane Rupture Time/Date: 8:12 AM,01/11/2024  Delivery Method:C-Section, Low Transverse Operative Delivery:N/A Details of operation can be found in separate operative note.  Patient had an uncomplicated postpartum course.  She is ambulating, tolerating a regular diet, passing flatus, and urinating well. Patient is discharged home in stable condition on  01/13/24        Newborn Data: Birth date:01/11/2024 Birth time:8:13 AM Gender:Female "Savannah" Living status:Living Apgars:9 ,9  Weight:3880 g                                              Post partum procedures: none Induction:: N/A Complications: None Delivery Type: repeat cesarean section, low transverse incision Anesthesia: spinal anesthesia Placenta: spontaneous and manual removal To Pathology: No   Prenatal Labs:  Blood type/Rh A positive (06/07/2023)  Antibody screen  negative (06/07/2023)  Rubella Immune  (06/07/2023)  Varicella Immune (06/07/2023)    RPR NR (06/07/2023, 11/02/2023)  HBsAg negative (06/07/2023)  HIV negative (06/07/2023, 11/06/2023)  HepC Negative (06/07/2023)  GC negative (06/07/2023, 12/19/2023)  Chlamydia negative (06/07/2023. 12/19/2023)  Genetic screening Diploid (06/22/2023)  1 hour GTT 153   3 hour GTT 78, 116, 96, 63  GBS negative on 12/19/2023    Magnesium Sulfate received: No BMZ received: No Rhophylac:was not indicated MMR: was not indicated Varivax vaccine given: was not indicated T-DaP:Given prenatally Flu: Given prenatally  Transfusion:No  Physical exam  Vitals:   01/12/24 0807 01/12/24 1512 01/13/24 0014 01/13/24 0842  BP: 115/61 117/80 106/65 114/60  Pulse: 93 80 93 86  Resp: 18 20 16    Temp: 98 F (36.7 C) 97.8 F (36.6 C) 98.1 F (36.7 C) (!) 97.4 F (36.3 C)  TempSrc: Oral Oral Oral Oral  SpO2: 97%  100% 99%  Weight:      Height:       General: alert, cooperative, and no distress Lochia: appropriate Uterine Fundus: firm Perineum:minimal edema/intact Incision: Healing well with no significant drainage, No significant erythema, Dressing is clean, dry, and intact, covered with occlusive OP site dressing  DVT Evaluation: No evidence of DVT seen on physical exam. Negative Homan's sign. No cords or calf tenderness. No significant calf/ankle edema.  Labs: Lab Results  Component Value Date   WBC 15.8 (H) 01/12/2024   HGB 9.1 (L) 01/12/2024   HCT 27.3 (L) 01/12/2024   MCV 86.1 01/12/2024   PLT 259 01/12/2024  Latest Ref Rng & Units 06/18/2020    9:02 AM  CMP  Glucose 70 - 99 mg/dL 235   BUN 6 - 23 mg/dL 11   Creatinine 5.73 - 1.20 mg/dL 2.20   Sodium 254 - 270 mEq/L 137   Potassium 3.5 - 5.1 mEq/L 4.3   Chloride 96 - 112 mEq/L 105   CO2 19 - 32 mEq/L 25   Calcium 8.4 - 10.5 mg/dL 9.4   Total Protein 6.0 - 8.3 g/dL 7.2   Total Bilirubin 0.2 - 1.2 mg/dL 0.5   Alkaline Phos 39 - 117 U/L 67   AST 0 - 37  U/L 12   ALT 0 - 35 U/L 16    Edinburgh Score:    01/12/2024    9:00 PM  Edinburgh Postnatal Depression Scale Screening Tool  I have been able to laugh and see the funny side of things. 0  I have looked forward with enjoyment to things. 0  I have blamed myself unnecessarily when things went wrong. 0  I have been anxious or worried for no good reason. 2  I have felt scared or panicky for no good reason. 0  Things have been getting on top of me. 1  I have been so unhappy that I have had difficulty sleeping. 0  I have felt sad or miserable. 0  I have been so unhappy that I have been crying. 0  The thought of harming myself has occurred to me. 0  Edinburgh Postnatal Depression Scale Total 3    Risk assessment for postpartum VTE and prophylactic treatment: Very high risk factors: None High risk factors: None Moderate risk factors: Cesarean delivery   Postpartum VTE prophylaxis with LMWH not indicated  After visit meds:  Allergies as of 01/13/2024   No Known Allergies      Medication List     STOP taking these medications    acetaminophen 500 MG tablet Commonly known as: TYLENOL   calcium carbonate 750 MG chewable tablet Commonly known as: TUMS EX   ferrous sulfate 325 (65 FE) MG EC tablet Replaced by: ferrous sulfate 325 (65 FE) MG tablet       TAKE these medications    alum & mag hydroxide-simeth 200-200-20 MG/5ML suspension Commonly known as: MAALOX/MYLANTA Take 15 mLs by mouth every 8 (eight) hours as needed for indigestion or heartburn.   coconut oil Oil Apply 1 Application topically as needed.   COLLAGEN PO Take 1 Dose by mouth in the morning.   ferrous sulfate 325 (65 FE) MG tablet Take 1 tablet (325 mg total) by mouth 2 (two) times daily with a meal. Replaces: ferrous sulfate 325 (65 FE) MG EC tablet   ibuprofen 600 MG tablet Commonly known as: ADVIL Take 1 tablet (600 mg total) by mouth every 6 (six) hours.   multivitamin-prenatal 27-0.8 MG Tabs  tablet Take 1 tablet by mouth in the morning.   oxyCODONE-acetaminophen 5-325 MG tablet Commonly known as: PERCOCET/ROXICET Take 1-2 tablets by mouth every 4 (four) hours as needed for moderate pain (pain score 4-6) or severe pain (pain score 7-10) (take 1 tablet for pain 1-6, take 2 tableis for pain 7-10).   senna-docusate 8.6-50 MG tablet Commonly known as: Senokot-S Take 2 tablets by mouth daily.   simethicone 80 MG chewable tablet Commonly known as: MYLICON Chew 2 tablets (160 mg total) by mouth as needed for flatulence.       Discharge home in stable condition Infant Feeding: Bottle and Breast  Infant Disposition:home with mother Discharge instruction: per After Visit Summary and Postpartum booklet. Activity: Advance as tolerated. Pelvic rest for 6 weeks.  Diet: routine diet and iron rich diet Anticipated Birth Control: IUD Postpartum Appointment:6 weeks Additional Postpartum F/U: Incision check 1 week Future Appointments:No future appointments. Follow up Visit:  Follow-up Information     Conard Novak, MD. Go on 01/19/2024.   Specialty: Obstetrics and Gynecology Why: Post-op follow up, For incision check, (Keep previously scheduled appointment) Contact information: [address] [place] [address] [phone]         Conard Novak, MD Follow up in 2 week(s).   Specialty: Obstetrics and Gynecology Why: postpartum mood check Contact information: [address] [place] [address] [phone]         Conard Novak, MD Follow up in 6 week(s).   Specialty: Obstetrics and Gynecology Why: postpartum visit and Liletta IUD placement Contact information: [address] [place] [address] [phone]                 Plan:  Briawna Carver was discharged to home in good condition. Follow-up appointment as directed.    Signed: Chari Manning CNM

## 2024-01-11 NOTE — Lactation Note (Signed)
 This note was copied from a baby's chart. Lactation Consultation Note  Patient Name: Mercedes Kramer [id number] Date: 01/11/2024 Age:32 hours Reason for consult: Initial assessment;Term   Maternal Data Initial assessment w/ a 7hr old baby Mercedes.  Upon entry into room, patient had several visitors and was eating.  Patient stated to [facility] that she wants to provide colostrum in the hospital and then just do formula.    Feeding Mother's Current Feeding Choice: Breast Milk and Formula  Interventions Interventions: Education  Lactation will return at a later time to provide education.   LC provided a report to RN.  Consult Status Consult Status: Follow-up Follow-up type: In-patient    Yvette Rack Dasiah Hooley 01/11/2024, 4:05 PM

## 2024-01-11 NOTE — Interval H&P Note (Signed)
 History and Physical Interval Note:  01/11/2024 7:28 AM  Tur[place] Bise  has presented today for surgery, with the diagnosis of prior cesarean.  The various methods of treatment have been discussed with the patient and family. After consideration of risks, benefits and other options for treatment, the patient has consented to  Procedure(s): REPEAT CESAREAN SECTION (N/A) as a surgical intervention.  The patient's history has been reviewed, patient examined, no change in status, stable for surgery.  I have reviewed the patient's chart and labs.  Questions were answered to the patient's satisfaction.    Thomasene Mohair, MD, [facility] 01/11/2024 7:28 AM

## 2024-01-11 NOTE — Anesthesia Preprocedure Evaluation (Signed)
 Anesthesia Evaluation  Patient identified by MRN, date of birth, ID band Patient awake    Reviewed: Allergy & Precautions, NPO status , Patient's Chart, lab work & pertinent test results  Airway Mallampati: III  TM Distance: >3 FB Neck ROM: full    Dental  (+) Dental Advidsory Given, Teeth Intact   Pulmonary neg pulmonary ROS   Pulmonary exam normal        Cardiovascular Exercise Tolerance: Good negative cardio ROS Normal cardiovascular exam     Neuro/Psych    GI/Hepatic negative GI ROS,,,  Endo/Other    Renal/GU   negative genitourinary   Musculoskeletal   Abdominal   Peds  Hematology negative hematology ROS (+)   Anesthesia Other Findings Patient states that during her last c section, she felt like she couldn't breathe and she felt very itchy. Per her chart, patient had a CSE and morphine added into her spinal. We will plan for a spinal without morphine today. After discussion with the patient, she stated she understood and agreed to plan.   Past Medical History: No date: Acne No date: ADHD (attention deficit hyperactivity disorder) No date: Anemia No date: Anesthesia     Comment:  spinal caused pt to be itchy after would like Benadryl               if possible pre op No date: Complication of anesthesia No date: Tinea versicolor  Past Surgical History: 09/20/2019: CESAREAN SECTION     Reproductive/Obstetrics (+) Pregnancy                             Anesthesia Physical Anesthesia Plan  ASA: 2  Anesthesia Plan: Spinal   Post-op Pain Management:    Induction:   PONV Risk Score and Plan: 2 and Ondansetron, Dexamethasone and TIVA  Airway Management Planned: Natural Airway and Nasal Cannula  Additional Equipment:   Intra-op Plan:   Post-operative Plan:   Informed Consent: I have reviewed the patients History and Physical, chart, labs and discussed the procedure  including the risks, benefits and alternatives for the proposed anesthesia with the patient or authorized representative who has indicated his/her understanding and acceptance.     Dental Advisory Given  Plan Discussed with: Anesthesiologist, CRNA and Surgeon  Anesthesia Plan Comments: (Patient reports no bleeding problems and no anticoagulant use.  Plan for spinal with backup GA  Patient consented for risks of anesthesia including but not limited to:  - adverse reactions to medications - damage to eyes, teeth, lips or other oral mucosa - nerve damage due to positioning  - risk of bleeding, infection and or nerve damage from spinal that could lead to paralysis - risk of headache or failed spinal - damage to teeth, lips or other oral mucosa - sore throat or hoarseness - damage to heart, brain, nerves, lungs, other parts of body or loss of life  Patient voiced understanding and assent.)       Anesthesia Quick Evaluation

## 2024-01-11 NOTE — Progress Notes (Signed)
 On-Q leaking and leaked through on abdominal binder. Nurse changed out dressing and applied new steri strips/tegaderm. Will continue to reassess.

## 2024-01-11 NOTE — Op Note (Signed)
 Cesarean Section Operative Note    Patient Name: Mercedes Kramer  Date of Birth: 02-03-1991  MRN: [id number]  Date of Surgery: 01/11/2024   Pre-operative Diagnosis:  1) History of cesarean delivery, desires repeat 2) intrauterine pregnancy at [redacted]w[redacted]d   Post-operative Diagnosis:  1) History of cesarean delivery, desires repeat 2) intrauterine pregnancy at [redacted]w[redacted]d    Procedure: Repeat low transverse cesarean delivery via Pfannenstiel incision with double layer uterine closure  Surgeon: Surgeons and Role:    Conard Novak, MD - Primary   Assistants: Donato Schultz, CNM; No other capable assistant available, in surgery requiring high level assistant.  Anesthesia: spinal   Findings:  1) normal appearing gravid uterus, fallopian tubes, and ovaries 2) viable female infant with weight of 3,880 grams (8 lbs 9 oz), AGPARs 9 and 9   Quantified Blood Loss: 410 mL  Total IV Fluids: 1,100 ml   Urine Output: 200 mL clear urine at end of case  Specimens: none  Complications: no complications  Disposition: PACU - hemodynamically stable.   Maternal Condition: stable   Baby condition / location:  Couplet care / Skin to Skin  Procedure Details:  The patient was seen in the Holding Room. The risks, benefits, complications, treatment options, and expected outcomes were discussed with the patient. The patient concurred with the proposed plan, giving informed consent. identified as Mercedes Kramer and the procedure verified as C-Section Delivery. A Time Out was held and the above information confirmed.   After induction of anesthesia, the patient was draped and prepped in the usual sterile manner. A Pfannenstiel incision was made and carried down through the subcutaneous tissue to the fascia. Fascial incision was made and extended transversely. The fascia was separated from the underlying rectus tissue superiorly and inferiorly. The peritoneum was identified and entered. Peritoneal incision was  extended longitudinally. The bladder flap was bluntly and sharply freed from the lower uterine segment. A low transverse uterine incision was made and the hysterotomy was extended with cranial-caudal tension. Delivered from cephalic presentation was a 3,880 gram Living newborn infant(s) or Female with Apgar scores of 9 at one minute and 9 at five minutes. Cord ph was not sent the umbilical cord was clamped and cut cord blood was not obtained for evaluation. The placenta was removed Intact and appeared normal. The uterine outline, tubes and ovaries appeared normal. The uterine incision was closed with running locked sutures of 0 Vicryl.  A second layer of the same suture was thrown in an imbricating fashion.  Hemostasis was assured.  The uterus was returned to the abdomen and the paracolic gutters were cleared of all clots and debris.  The rectus muscles were inspected and found to be hemostatic.  The On-Q catheter pumps were inserted in accordance with the manufacturer's recommendations.  The catheters were inserted approximately 4cm cephelad to the incision line, approximately 1cm apart, straddling the midline.  They were inserted to a depth of the 4th mark. They were positioned superficial to the rectus abdominus muscles and deep to the rectus fascia.    The fascia was then reapproximated with running sutures of 1-0 PDS, looped. The subcuticular closure was performed using 4-0 monocryl. The skin closure was reinforced using surgical skin glue.  The On-Q catheters were bolused with 5 mL of 0.5% marcaine plain for a total of 10 mL.  The catheters were affixed to the skin with surgical skin glue, steri-strips, and tegaderm.    The surgical assistant performed tissue retraction, assistance with suturing,  and fundal pressure.  Instrument, sponge, and needle counts were correct prior the abdominal closure and were correct at the conclusion of the case.  The patient received Ancef 2 gram IV prior to skin  incision (within 30 minutes). For VTE prophylaxis she was wearing SCDs throughout the case.  The assistant surgeon was a CNM due to lack of availability of another Sales promotion account executive.    Signed: Conard Novak, MD 01/11/2024 9:02 AM

## 2024-01-11 NOTE — Anesthesia Procedure Notes (Signed)
 Spinal  Patient location during procedure: OR Start time: 01/11/2024 7:39 AM End time: 01/11/2024 7:44 AM Reason for block: surgical anesthesia Staffing Performed: resident/CRNA  Resident/CRNA: Nelle Don, CRNA Performed by: Nelle Don, CRNA Authorized by: Stephanie Coup, MD   Preanesthetic Checklist Completed: patient identified, IV checked, risks and benefits discussed, surgical consent, monitors and equipment checked, pre-op evaluation and timeout performed Spinal Block Patient position: sitting Prep: ChloraPrep Patient monitoring: heart rate, continuous pulse ox and blood pressure Approach: midline Location: L3-4 Injection technique: single-shot Needle Needle type: Pencan  Needle gauge: 24 G Needle length: 10 cm Additional Notes Expiration date of kit checked. Clear CSF flow prior to injection. Dr.Woodson present

## 2024-01-12 ENCOUNTER — Encounter: Payer: Self-pay | Admitting: Obstetrics and Gynecology

## 2024-01-12 LAB — CBC
HCT: 27.3 % — ABNORMAL LOW (ref 36.0–46.0)
Hemoglobin: 9.1 g/dL — ABNORMAL LOW (ref 12.0–15.0)
MCH: 28.7 pg (ref 26.0–34.0)
MCHC: 33.3 g/dL (ref 30.0–36.0)
MCV: 86.1 fL (ref 80.0–100.0)
Platelets: 259 10*3/uL (ref 150–400)
RBC: 3.17 MIL/uL — ABNORMAL LOW (ref 3.87–5.11)
RDW: 15.5 % (ref 11.5–15.5)
WBC: 15.8 10*3/uL — ABNORMAL HIGH (ref 4.0–10.5)
nRBC: 0 % (ref 0.0–0.2)

## 2024-01-12 MED ORDER — CHEWING GUM (ORBIT) SUGAR FREE
1.0000 | CHEWING_GUM | ORAL | Status: DC
  Filled 2024-01-12: qty 1

## 2024-01-12 MED ORDER — SALINE SPRAY 0.65 % NA SOLN
1.0000 | NASAL | Status: DC | PRN
  Administered 2024-01-12: 1 via NASAL
  Filled 2024-01-12: qty 44

## 2024-01-12 MED ORDER — SIMETHICONE 80 MG PO CHEW
160.0000 mg | CHEWABLE_TABLET | Freq: Three times a day (TID) | ORAL | Status: DC
  Administered 2024-01-12 – 2024-01-13 (×3): 160 mg via ORAL
  Filled 2024-01-12 (×3): qty 2

## 2024-01-12 NOTE — Anesthesia Postprocedure Evaluation (Signed)
 Anesthesia Post Note  Patient: Mercedes Kramer  Procedure(s) Performed: REPEAT CESAREAN SECTION  Patient location during evaluation: Mother Baby Anesthesia Type: Spinal Level of consciousness: oriented and awake and alert Pain management: pain level controlled Vital Signs Assessment: post-procedure vital signs reviewed and stable Respiratory status: spontaneous breathing and respiratory function stable Cardiovascular status: blood pressure returned to baseline and stable Postop Assessment: no headache, no backache, no apparent nausea or vomiting and able to ambulate Anesthetic complications: no   No notable events documented.   Last Vitals:  Vitals:   01/11/24 2324 01/12/24 0453  BP: 108/74 125/82  Pulse: 91 87  Resp: 20 20  Temp: 36.5 C 36.8 C  SpO2: 100% 100%    Last Pain:  Vitals:   01/12/24 0613  TempSrc:   PainSc: 2                  Jules Schick

## 2024-01-12 NOTE — Progress Notes (Signed)
 Pt called out complaining of 10/10 gas pain again. She explained to [place] that she drank 2-3 sodas through a straw yesterday evening because she forgot she was not supposed to.   The pain in all in her R upper shoulder. Nurse has given simethicone and mylanta to try to remedy and explained the importance of walking around and moving to work it out.   Nurse called to L&D to get any tips for this. Nurse Lurena Joiner is coming to try to massage this out.   Pt's on-q pump is also leaking continuously. It appears to be intact and does not leak when pressing. Nurse has changed this 3x throughout the shift. Will be sure to pass on for Dr. Jean Rosenthal to assess this AM.

## 2024-01-12 NOTE — Lactation Note (Signed)
 This note was copied from a baby's chart. Lactation Consultation Note  Patient Name: Mercedes Kramer [id number] Date: 01/12/2024 Age:32 hours Reason for consult: Follow-up assessment;Term   Maternal Data Does the patient have breastfeeding experience prior to this delivery?: Yes How long did the patient breastfeed?: approx 3 wks, then pumped  Feeding Mother's Current Feeding Choice: Breast Milk and Formula Nipple Type: Slow - flow Mom has not breastfed since yesterday, states she going to see how the baby does, was spitty yesterday, mom has been uncomfortable with gas, has been formula feeding, offered assistance when she does want to breastfeed, baby has not eaten since early am, advised to wake baby to eat.  Baby asleep in dad's arms LATCH Score Latch:  (I did not observe a feeding at the breast)                  Lactation Tools Discussed/Used    Interventions Interventions: Education [facility] name written on white board for assistance if requested Discharge Pump: Advised to call insurance company [facility] Program: No  Consult Status Consult Status: PRN Date: 01/12/24 Follow-up type: In-patient    Dyann Kief 01/12/2024, 12:34 PM

## 2024-01-12 NOTE — Progress Notes (Signed)
 Postop Day  1  Subjective: 33 y.o. [name] postpartum day #1 status post repeat cesarean section. She is ambulating, is tolerating po, is voiding spontaneously.  Her pain is well controlled on PO pain medications. Her lochia is less than menses.  Mercedes Kramer reports increased gas pain that started yesterday. She was initially drinking liquids through straws and has recently stopped. She tried peppermint tea and several position changes and activities to help mobilize gas.  Reports she is passing gas more readily but still having some pain in her right shoulder.   Dressing for On-Q ball insertion site has been changed 3 times overnight due to drainage.   Objective: BP 115/61 (BP Location: Left Arm)   Pulse 93   Temp 98 F (36.7 C) (Oral)   Resp 18   Ht 5\' 6"  (1.676 m)   Wt 95.7 kg   LMP 03/25/2023   SpO2 97%   Breastfeeding Unknown   BMI 34.06 kg/m    Physical Exam:  General: alert, cooperative, and no distress Breasts: soft/nontender Pulm: nl effort Abdomen: soft, non-tender, active bowel sounds Uterine Fundus: firm Incision: healing well, no significant drainage, covered with occlusive OP site. Moderate amount of serosanguinous drainage noted on dressing for On-q ball insertion site.  Perineum: minimal edema, intact Lochia: appropriate DVT Evaluation: No evidence of DVT seen on physical exam.  Recent Labs    01/12/24 0352  HGB 9.1*  HCT 27.3*  WBC 15.8*  PLT 259    Assessment/Plan: 32 y.o. G2P2002 postpartum day # 1  1. Continue routine postpartum care -Continue to monitor insertion site for ON-Q ball -Can consider dressing change with dermabond  -Will consider removing On-Q ball if continues to have drainage requiring dressing changes   2. Infant feeding status: formula feeding -Encouraged snug fitting bra, cold application, Tylenol PRN, and cabbage leaves for engorgement for formula feeding   3. Contraception plan:  TBD  4. Acute blood loss anemia - clinically  significant.  -Hemodynamically stable and asymptomatic -Intervention: start on oral supplementation with ferrous sulfate 325 mg   5. Immunization status:   all immunizations up to date  6. Gas pain  -Reports increased gas pain in shoulder that started yesterday  -Improved after peppermint tea, activity, stopped using straws. States she is passing more gas and this has helped  -Bowel sounds active  -Will add in chewing gum  -Continue to avoid straws for drinks  -Simethicone scheduled with meals  -Walking in unit and around room  -Can also try warm apple juice mixed with gingerale    Disposition: continue inpatient postpartum care    LOS: 1 day   Gustavo Lah, CNM 01/12/2024, 9:11 AM   ----- [name]  Certified Nurse Midwife [facility]

## 2024-01-13 MED ORDER — IBUPROFEN 600 MG PO TABS: 600.0000 mg | ORAL_TABLET | Freq: Four times a day (QID) | ORAL | 0 refills | Status: AC

## 2024-01-13 MED ORDER — COCONUT OIL OIL: 1.0000 | TOPICAL_OIL | Status: AC | PRN

## 2024-01-13 MED ORDER — FERROUS SULFATE 325 (65 FE) MG PO TABS
325.0000 mg | ORAL_TABLET | Freq: Two times a day (BID) | ORAL | Status: AC
Start: 2024-01-13 — End: ?

## 2024-01-13 MED ORDER — ALUM & MAG HYDROXIDE-SIMETH 200-200-20 MG/5ML PO SUSP: 15.0000 mL | Freq: Three times a day (TID) | ORAL | Status: AC | PRN

## 2024-01-13 MED ORDER — SIMETHICONE 80 MG PO CHEW: 160.0000 mg | CHEWABLE_TABLET | ORAL | Status: AC | PRN

## 2024-01-13 MED ORDER — OXYCODONE-ACETAMINOPHEN 5-325 MG PO TABS: 1.0000 | ORAL_TABLET | ORAL | 0 refills | Status: AC | PRN

## 2024-01-13 MED ORDER — SENNOSIDES-DOCUSATE SODIUM 8.6-50 MG PO TABS: 2.0000 | ORAL_TABLET | ORAL | Status: AC

## 2024-01-13 NOTE — Progress Notes (Signed)
 Discharge instructions reviewed with patient and significant other.  Questions answered and follow up care reviewed.  Printed copies given to patient for reference after discharge home.
